# Patient Record
Sex: Male | Born: 1935 | Hispanic: Yes | Marital: Married | Smoking: Former smoker
Health system: Southern US, Community
[De-identification: ages and names within clinical notes are randomized; demographics above are authoritative.]

## PROBLEM LIST (undated history)

## (undated) DIAGNOSIS — K56609 Unspecified intestinal obstruction, unspecified as to partial versus complete obstruction: Secondary | ICD-10-CM

## (undated) DIAGNOSIS — C8591 Non-Hodgkin lymphoma, unspecified, lymph nodes of head, face, and neck: Secondary | ICD-10-CM

## (undated) DIAGNOSIS — E039 Hypothyroidism, unspecified: Secondary | ICD-10-CM

## (undated) HISTORY — DX: Hypothyroidism, unspecified: E03.9

## (undated) HISTORY — DX: Unspecified intestinal obstruction, unspecified as to partial versus complete obstruction: K56.609

---

## 2003-06-18 HISTORY — PX: LAPAROSCOPIC CHOLECYSTECTOMY: SUR755

## 2006-06-17 DIAGNOSIS — C8591 Non-Hodgkin lymphoma, unspecified, lymph nodes of head, face, and neck: Secondary | ICD-10-CM

## 2006-06-17 HISTORY — DX: Non-Hodgkin lymphoma, unspecified, lymph nodes of head, face, and neck: C85.91

## 2011-06-24 ENCOUNTER — Encounter: Payer: Self-pay | Admitting: Internal Medicine

## 2011-06-24 ENCOUNTER — Ambulatory Visit (INDEPENDENT_AMBULATORY_CARE_PROVIDER_SITE_OTHER): Payer: Medicare Other | Admitting: Internal Medicine

## 2011-06-24 VITALS — BP 128/80 | HR 66 | Temp 98.1°F | Resp 16 | Ht 63.0 in | Wt 156.0 lb

## 2011-06-24 DIAGNOSIS — Z0000 Encounter for general adult medical examination without abnormal findings: Secondary | ICD-10-CM

## 2011-06-24 NOTE — Patient Instructions (Signed)
It is important that you exercise regularly, at least 20 minutes 3 to 4 times per week.  If you develop chest pain or shortness of breath seek  medical attention.  Limit your sodium (Salt) intake  Return in one year for follow-up  Please obtain records from your previous primary care provider

## 2011-06-24 NOTE — Progress Notes (Signed)
Subjective:    Patient ID: Randall Williams, male    DOB: Jun 05, 1933, 78 y.o.   MRN: [id number]  HPI  78 year old patient who enjoys remarkably good health who has relocated to the area from [place]. He is a retired Comptroller and enjoys good health. He was hospitalized briefly in 2005 for a cholecystectomy otherwise no hospitalizations. He has no medical illnesses and takes no chronic medications.   1. Risk factors, based on past  M,S,F history  no cardiovascular risk factors 2.  Physical activities: Remains quite active with a regular exercise program. Actually played soccer occasionally  3.  Depression/mood: No history depression or mood disorder  4.  Hearing: No deficits  5.  ADL's: Independent in all aspects of daily living  6.  Fall risk: Low  7.  Home safety: no problems identified  8.  Height weight, and visual acuity; height and weight stable no change in visual acuity  9.  Counseling: Heart healthy diet regular exercise own curves 10. Lab orders based on risk factors: Will check laboratory panel at the time of his physical in 6 months 11. Referral :  12. Care plan: Not appropriate at this time  13. Cognitive assessment: Alert and oriented with normal affect. No cognitive dysfunction.   Family history unremarkable father died at 74 of complications of pneumonia mother died at 77 5 brothers 4 sisters in good health Born and raised in [place]   Review of Systems  Constitutional: Negative for fever, chills, appetite change and fatigue.  HENT: Negative for hearing loss, ear pain, congestion, sore throat, trouble swallowing, neck stiffness, dental problem, voice change and tinnitus.   Eyes: Negative for pain, discharge and visual disturbance.  Respiratory: Negative for cough, chest tightness, wheezing and stridor.   Cardiovascular: Negative for chest pain, palpitations and leg swelling.  Gastrointestinal: Negative for nausea, vomiting, abdominal pain, diarrhea,  constipation, blood in stool and abdominal distention.  Genitourinary: Negative for urgency, hematuria, flank pain, discharge, difficulty urinating and genital sores.  Musculoskeletal: Negative for myalgias, back pain, joint swelling, arthralgias and gait problem.  Skin: Negative for rash.  Neurological: Negative for dizziness, syncope, speech difficulty, weakness, numbness and headaches.  Hematological: Negative for adenopathy. Does not bruise/bleed easily.  Psychiatric/Behavioral: Negative for behavioral problems and dysphoric mood. The patient is not nervous/anxious.        Objective:   Physical Exam  Constitutional: He appears well-developed and well-nourished.  HENT:  Head: Normocephalic and atraumatic.  Right Ear: External ear normal.  Left Ear: External ear normal.  Nose: Nose normal.  Mouth/Throat: Oropharynx is clear and moist.  Eyes: Conjunctivae and EOM are normal. Pupils are equal, round, and reactive to light. No scleral icterus.  Neck: Normal range of motion. Neck supple. No JVD present. No thyromegaly present.  Cardiovascular: Regular rhythm, normal heart sounds and intact distal pulses.  Exam reveals no gallop and no friction rub.   No murmur heard. Pulmonary/Chest: Effort normal and breath sounds normal. He exhibits no tenderness.  Abdominal: Soft. Bowel sounds are normal. He exhibits no distension and no mass. There is no tenderness.  Musculoskeletal: Normal range of motion. He exhibits no edema and no tenderness.  Lymphadenopathy:    He has no cervical adenopathy.  Neurological: He is alert. He has normal reflexes. No cranial nerve deficit. Coordination normal.  Skin: Skin is warm and dry. No rash noted.  Psychiatric: He has a normal mood and affect. His behavior is normal.  Assessment & Plan:   Preventive health exam . Medical records will be obtained from his prior physician Return in one year for an annual exam

## 2011-12-17 ENCOUNTER — Ambulatory Visit (INDEPENDENT_AMBULATORY_CARE_PROVIDER_SITE_OTHER): Payer: Medicare Other | Admitting: Internal Medicine

## 2011-12-17 ENCOUNTER — Encounter: Payer: Self-pay | Admitting: Internal Medicine

## 2011-12-17 VITALS — BP 120/70 | Temp 98.5°F | Wt 155.0 lb

## 2011-12-17 DIAGNOSIS — H109 Unspecified conjunctivitis: Secondary | ICD-10-CM | POA: Diagnosis not present

## 2011-12-17 MED ORDER — SULFACETAMIDE SODIUM 10 % OP SOLN: 2.0000 [drp] | OPHTHALMIC | Status: AC

## 2011-12-17 NOTE — Patient Instructions (Signed)
Conjunctivitis Conjunctivitis is commonly called "pink eye." Conjunctivitis can be caused by bacterial or viral infection, allergies, or injuries. There is usually redness of the lining of the eye, itching, discomfort, and sometimes discharge. There may be deposits of matter along the eyelids. A viral infection usually causes a watery discharge, while a bacterial infection causes a yellowish, thick discharge. Pink eye is very contagious and spreads by direct contact. You may be given antibiotic eyedrops as part of your treatment. Before using your eye medicine, remove all drainage from the eye by washing gently with warm water and cotton balls. Continue to use the medication until you have awakened 2 mornings in a row without discharge from the eye. Do not rub your eye. This increases the irritation and helps spread infection. Use separate towels from other household members. Wash your hands with soap and water before and after touching your eyes. Use cold compresses to reduce pain and sunglasses to relieve irritation from light. Do not wear contact lenses or wear eye makeup until the infection is gone. SEEK MEDICAL CARE IF:   Your symptoms are not better after 3 days of treatment.   You have increased pain or trouble seeing.   The outer eyelids become very red or swollen.  Document Released: 07/11/2004 Document Revised: 05/23/2011 Document Reviewed: 06/03/2005 [employer] Patient Information 2012 [employer]. 

## 2011-12-17 NOTE — Progress Notes (Signed)
  Subjective:    Patient ID: Randall Williams, male    DOB: [birth year], 78 y.o.   MRN: [id number]  HPI  78 year old patient who presents with a several-day history of some irritation and redness involving the left eye. His chief complaint is itching. No change in visual acuity or eye pain.    Review of Systems  Eyes: Positive for redness and itching. Negative for photophobia, pain and visual disturbance.       Objective:   Physical Exam  Constitutional: He appears well-developed and well-nourished. No distress.  Eyes:       The left eye had conjunctival injection. No exudate noted cornea clear          Assessment & Plan:   Conjunctivitis. We'll treat with Bleph-10. Patient has not had a eye exam in greater than one-year. Encouraged to follow up with ophthalmology at his convenience

## 2012-02-03 ENCOUNTER — Ambulatory Visit (INDEPENDENT_AMBULATORY_CARE_PROVIDER_SITE_OTHER): Payer: Medicare Other | Admitting: Internal Medicine

## 2012-02-03 ENCOUNTER — Encounter: Payer: Self-pay | Admitting: Internal Medicine

## 2012-02-03 VITALS — BP 110/68 | Temp 97.9°F | Wt 152.0 lb

## 2012-02-03 DIAGNOSIS — H6120 Impacted cerumen, unspecified ear: Secondary | ICD-10-CM | POA: Diagnosis not present

## 2012-02-03 DIAGNOSIS — H918X9 Other specified hearing loss, unspecified ear: Secondary | ICD-10-CM

## 2012-02-03 NOTE — Patient Instructions (Signed)
Call or return to clinic prn if these symptoms worsen or fail to improve as anticipated.

## 2012-02-03 NOTE — Progress Notes (Signed)
  Subjective:    Patient ID: Randall Williams, male    DOB: 1932/09/02, 78 y.o.   MRN: [id number]  HPI  78 year old patient who enjoys remarkably good health. He takes no chronic medications. He presents today with a chief complaint of diminished auditory acuity on the left. Following left canal irrigation his hearing normal eyes    Review of Systems  HENT: Positive for hearing loss.        Objective:   Physical Exam  Constitutional: He appears well-developed and well-nourished. No distress.  HENT:       Left canal the cerumen impaction. Irrigated until clear          Assessment & Plan:   Left-sided cerumen impaction. Irrigated until clear

## 2012-02-20 DIAGNOSIS — Z23 Encounter for immunization: Secondary | ICD-10-CM | POA: Diagnosis not present

## 2012-06-22 ENCOUNTER — Ambulatory Visit (INDEPENDENT_AMBULATORY_CARE_PROVIDER_SITE_OTHER): Payer: Medicare Other | Admitting: Family Medicine

## 2012-06-22 ENCOUNTER — Encounter: Payer: Self-pay | Admitting: Family Medicine

## 2012-06-22 VITALS — BP 110/70 | HR 87 | Temp 98.6°F | Wt 153.0 lb

## 2012-06-22 DIAGNOSIS — J329 Chronic sinusitis, unspecified: Secondary | ICD-10-CM | POA: Diagnosis not present

## 2012-06-22 MED ORDER — HYDROCODONE-HOMATROPINE 5-1.5 MG/5ML PO SYRP: 5.0000 mL | ORAL_SOLUTION | ORAL | Status: AC | PRN

## 2012-06-22 MED ORDER — AZITHROMYCIN 250 MG PO TABS: ORAL_TABLET | ORAL | Status: DC

## 2012-06-22 NOTE — Progress Notes (Signed)
  Subjective:    Patient ID: Randall Williams, male    DOB: 07/30/1932, 79 y.o.   MRN: [id number]  HPI Here for 4 days of sinus pressure, PND, ST, and a dry cough. No fever.    Review of Systems  Constitutional: Negative.   HENT: Positive for congestion, postnasal drip and sinus pressure. Negative for rhinorrhea.   Eyes: Negative.   Respiratory: Negative.        Objective:   Physical Exam  Constitutional: He appears well-developed and well-nourished.  HENT:  Right Ear: External ear normal.  Left Ear: External ear normal.  Nose: Nose normal.  Mouth/Throat: Oropharynx is clear and moist.  Eyes: Conjunctivae normal are normal.  Neck: No thyromegaly present.  Pulmonary/Chest: Effort normal and breath sounds normal.  Lymphadenopathy:    He has no cervical adenopathy.          Assessment & Plan:  Add Mucinex

## 2012-06-23 ENCOUNTER — Ambulatory Visit: Payer: Medicare Other | Admitting: Internal Medicine

## 2013-02-04 ENCOUNTER — Encounter: Payer: Self-pay | Admitting: Internal Medicine

## 2013-02-04 ENCOUNTER — Ambulatory Visit (INDEPENDENT_AMBULATORY_CARE_PROVIDER_SITE_OTHER): Payer: Medicare Other | Admitting: Internal Medicine

## 2013-02-04 VITALS — BP 120/80 | HR 77 | Temp 98.2°F | Wt 155.0 lb

## 2013-02-04 DIAGNOSIS — L259 Unspecified contact dermatitis, unspecified cause: Secondary | ICD-10-CM

## 2013-02-04 MED ORDER — TRIAMCINOLONE ACETONIDE 0.1 % EX CREA: TOPICAL_CREAM | Freq: Two times a day (BID) | CUTANEOUS | Status: DC

## 2013-02-04 NOTE — Progress Notes (Signed)
  Subjective:    Patient ID: Randall Williams, male    DOB: 1932/12/15, 79 y.o.   MRN: [id number]  HPI  79 year old patient who was out doing yard work 5 days ago. The following day he noted a very pruritic rash involving primarily his forearms. He has been using 1% hydrocortisone with some benefit. Otherwise he feels well. Do to the parotid she has had some mild difficulty with sleep.  History reviewed. No pertinent past medical history.  History   Social History  . Marital Status: Married    Spouse Name: N/A    Number of Children: N/A  . Years of Education: N/A   Occupational History  . Not on file.   Social History Main Topics  . Smoking status: Former Smoker    Quit date: 06/17/1968  . Smokeless tobacco: Never Used  . Alcohol Use: No  . Drug Use: No  . Sexual Activity: Not on file   Other Topics Concern  . Not on file   Social History Narrative  . No narrative on file    History reviewed. No pertinent past surgical history.  No family history on file.  No Known Allergies  Current Outpatient Prescriptions on File Prior to Visit  Medication Sig Dispense Refill  . Multiple Vitamins-Minerals (EYE VITAMINS) CAPS Take by mouth daily.       No current facility-administered medications on file prior to visit.    BP 120/80  Pulse 77  Temp(Src) 98.2 F (36.8 C) (Oral)  Wt 155 lb (70.308 kg)  BMI 27.46 kg/m2  SpO2 96%       Review of Systems  Skin: Positive for rash.       Objective:   Physical Exam  Constitutional: He appears well-developed and well-nourished. No distress.  Skin: Rash noted.  Erythematous papular rash with some scaling noted involving the outer aspects of both lower arms          Assessment & Plan:  Content dermatitis. Will treat with at bedtime Benadryl as well as triamcinolone cream. Rash is fairly mild and limited

## 2013-02-04 NOTE — Patient Instructions (Addendum)
Benadryl 25 mg at bedtime  Use triamcinolone cream twice daily  Call or return to clinic prn if these symptoms worsen or fail to improve as anticipated. Contact Dermatitis Contact dermatitis is a rash that happens when something touches the skin. You touched something that irritates your skin, or you have allergies to something you touched. HOME CARE   Avoid the thing that caused your rash.  Keep your rash away from hot water, soap, sunlight, chemicals, and other things that might bother it.  Do not scratch your rash.  You can take cool baths to help stop itching.  Only take medicine as told by your doctor.  Keep all doctor visits as told. GET HELP RIGHT AWAY IF:   Your rash is not better after 3 days.  Your rash gets worse.  Your rash is puffy (swollen), tender, red, sore, or warm.  You have problems with your medicine. MAKE SURE YOU:   Understand these instructions.  Will watch your condition.  Will get help right away if you are not doing well or get worse. Document Released: 03/31/2009 Document Revised: 08/26/2011 Document Reviewed: 11/06/2010 [facility] Patient Information 2014 [place], [place].

## 2013-02-23 DIAGNOSIS — Z23 Encounter for immunization: Secondary | ICD-10-CM | POA: Diagnosis not present

## 2013-06-30 DIAGNOSIS — H3530 Unspecified macular degeneration: Secondary | ICD-10-CM | POA: Diagnosis not present

## 2013-11-11 ENCOUNTER — Ambulatory Visit (INDEPENDENT_AMBULATORY_CARE_PROVIDER_SITE_OTHER): Payer: Medicare Other | Admitting: Internal Medicine

## 2013-11-11 ENCOUNTER — Encounter: Payer: Self-pay | Admitting: Internal Medicine

## 2013-11-11 VITALS — BP 140/80 | HR 76 | Temp 98.2°F | Resp 20 | Ht 63.0 in | Wt 158.0 lb

## 2013-11-11 DIAGNOSIS — L719 Rosacea, unspecified: Secondary | ICD-10-CM | POA: Diagnosis not present

## 2013-11-11 MED ORDER — MINOCYCLINE HCL 100 MG PO CAPS: 100.0000 mg | ORAL_CAPSULE | Freq: Two times a day (BID) | ORAL | Status: DC

## 2013-11-11 NOTE — Patient Instructions (Signed)
Rosacea Rosacea is a long-term (chronic) condition that affects the skin of the face (cheeks, nose, brow, and chin) and sometimes the eyes. Rosacea causes the blood vessels near the surface of the skin to enlarge, resulting in redness. This condition usually begins after age 78. It occurs most often in light-skinned women. Without treatment, rosacea tends to get worse over time. There is no cure for rosacea, but treatment can help control your symptoms. CAUSES  The cause is unknown. It is thought that some people may inherit a tendency to develop rosacea. Certain triggers can make your rosacea worse, including:  Hot baths.  Exercise.  Sunlight.  Very hot or cold temperatures.  Hot or spicy foods and drinks.  Drinking alcohol.  Stress.  Taking blood pressure medicine.  Long-term use of topical steroids on the face. SYMPTOMS   Redness of the face.  Red bumps or pimples on the face.  Red, enlarged nose (rhinophyma).  Blushing easily.  Red lines on the skin.  Irritated or burning feeling in the eyes.  Swollen eyelids. DIAGNOSIS  Your caregiver can usually tell what is wrong by asking about your symptoms and performing a physical exam. TREATMENT  Avoiding triggers is an important part of treatment. You will also need to see a skin specialist (dermatologist) who can develop a treatment plan for you. The goals of treatment are to control your condition and to improve the appearance of your skin. It may take several weeks or months of treatment before you notice an improvement in your skin. Even after your skin improves, you will likely need to continue treatment to prevent your rosacea from coming back. Treatment methods may include:  Using sunscreen or sunblock daily to protect the skin.  Antibiotic medicine, such as metronidazole, applied directly to the skin.  Antibiotics taken by mouth. This is usually prescribed if you have eye problems from your rosacea.  Laser surgery  to improve the appearance of the skin. This surgery can reduce the appearance of red lines on the skin and can remove excess tissue from the nose to reduce its size. HOME CARE INSTRUCTIONS  Avoid things that seem to trigger your flare-ups.  If you are given antibiotics, take them as directed. Finish them even if you start to feel better.  Use a gentle facial cleanser that does not contain alcohol.  You may use a mild facial moisturizer.  Use a sunscreen or sunblock with SPF 30 or greater.  Wear a green-tinted foundation powder to conceal redness, if needed. Choose cosmetics that are noncomedogenic. This means they do not block your pores.  If your eyelids are affected, apply warm compresses to the eyelids. Do this up to 4 times a day or as directed by your caregiver. SEEK MEDICAL CARE IF:  Your skin problems get worse.  You feel depressed.  You lose your appetite.  You have trouble concentrating.  You have problems with your eyes, such as redness or itching. MAKE SURE YOU:  Understand these instructions.  Will watch your condition.  Will get help right away if you are not doing well or get worse. Document Released: 07/11/2004 Document Revised: 12/03/2011 Document Reviewed: 05/14/2011 [facility] Patient Information 2014 [place], [place].

## 2013-11-11 NOTE — Progress Notes (Signed)
Pre-visit discussion using our clinic review tool. No additional management support is needed unless otherwise documented below in the visit note.  

## 2013-11-11 NOTE — Progress Notes (Signed)
   Subjective:    Patient ID: Randall Williams, male    DOB: 1932/07/12, 80 y.o.   MRN: [id number]  HPI  80 year old patient, who presents with a chief complaint of a facial rash.  For the past several days.  He has had some scattered, erythematous papules about the malar aspect of the face.  He is requesting a refill of miocin and states the generic variety was not very effective or well-tolerated.  History reviewed. No pertinent past medical history.  History   Social History  . Marital Status: Married    Spouse Name: N/A    Number of Children: N/A  . Years of Education: N/A   Occupational History  . Not on file.   Social History Main Topics  . Smoking status: Former Smoker    Quit date: 06/17/1968  . Smokeless tobacco: Never Used  . Alcohol Use: No  . Drug Use: No  . Sexual Activity: Not on file   Other Topics Concern  . Not on file   Social History Narrative  . No narrative on file    History reviewed. No pertinent past surgical history.  No family history on file.  No Known Allergies  Current Outpatient Prescriptions on File Prior to Visit  Medication Sig Dispense Refill  . Multiple Vitamins-Minerals (EYE VITAMINS) CAPS Take by mouth daily.       No current facility-administered medications on file prior to visit.    BP 140/80  Pulse 76  Temp(Src) 98.2 F (36.8 C) (Oral)  Resp 20  Ht 5\' 3"  (1.6 m)  Wt 158 lb (71.668 kg)  BMI 28.00 kg/m2  SpO2 98%       Review of Systems  Constitutional: Negative for fever, chills, appetite change and fatigue.  HENT: Negative for congestion, dental problem, ear pain, hearing loss, sore throat, tinnitus, trouble swallowing and voice change.   Eyes: Negative for pain, discharge and visual disturbance.  Respiratory: Negative for cough, chest tightness, wheezing and stridor.   Cardiovascular: Negative for chest pain, palpitations and leg swelling.  Gastrointestinal: Negative for nausea, vomiting, abdominal pain,  diarrhea, constipation, blood in stool and abdominal distention.  Genitourinary: Negative for urgency, hematuria, flank pain, discharge, difficulty urinating and genital sores.  Musculoskeletal: Negative for arthralgias, back pain, gait problem, joint swelling, myalgias and neck stiffness.  Skin: Positive for rash.  Neurological: Negative for dizziness, syncope, speech difficulty, weakness, numbness and headaches.  Hematological: Negative for adenopathy. Does not bruise/bleed easily.  Psychiatric/Behavioral: Negative for behavioral problems and dysphoric mood. The patient is not nervous/anxious.        Objective:   Physical Exam  Constitutional: He appears well-developed and well-nourished. No distress.  Skin:  A few scattered, mildly erythematous papules noted about the lower aspect of the face.  This was consistent with mild rosacea          Assessment & Plan:   Mild rosacea.  Will treat with 10 days of minocin  Schedule CPX

## 2013-12-02 ENCOUNTER — Telehealth: Payer: Self-pay | Admitting: Internal Medicine

## 2013-12-02 NOTE — Telephone Encounter (Signed)
error 

## 2013-12-03 ENCOUNTER — Telehealth: Payer: Self-pay | Admitting: Internal Medicine

## 2013-12-03 NOTE — Telephone Encounter (Signed)
Randall Williams, Randall Williams is requesting re-fill on minocycline (MINOCIN) 100 MG capsule

## 2013-12-06 NOTE — Telephone Encounter (Signed)
Refuse, refill not appropriate.

## 2013-12-16 ENCOUNTER — Other Ambulatory Visit: Payer: Self-pay | Admitting: Internal Medicine

## 2013-12-20 NOTE — Telephone Encounter (Signed)
OK to RF

## 2013-12-20 NOTE — Telephone Encounter (Signed)
Dr. Raliegh Ip, okay to refill minocycline?

## 2013-12-21 ENCOUNTER — Telehealth: Payer: Self-pay | Admitting: Internal Medicine

## 2013-12-21 MED ORDER — ERYTHROMYCIN 2 % EX PADS: 1.0000 | MEDICATED_PAD | Freq: Two times a day (BID) | CUTANEOUS | Status: DC

## 2013-12-21 NOTE — Telephone Encounter (Signed)
[facility] is requesting re-fill on ERYCETTE PLEDGETS, 2%, QTY 60, APPLY TWICE A DAY TOPICALLY

## 2013-12-21 NOTE — Telephone Encounter (Signed)
ok 

## 2013-12-21 NOTE — Telephone Encounter (Signed)
Rx sent 

## 2013-12-21 NOTE — Telephone Encounter (Signed)
Please advise if okay to fill  

## 2013-12-24 ENCOUNTER — Other Ambulatory Visit: Payer: Self-pay | Admitting: Internal Medicine

## 2013-12-27 NOTE — Telephone Encounter (Signed)
Okay to refill Minocycline?

## 2014-02-24 DIAGNOSIS — Z23 Encounter for immunization: Secondary | ICD-10-CM | POA: Diagnosis not present

## 2014-03-06 ENCOUNTER — Encounter (HOSPITAL_COMMUNITY): Payer: Self-pay | Admitting: Emergency Medicine

## 2014-03-06 ENCOUNTER — Emergency Department (HOSPITAL_COMMUNITY): Payer: Medicare Other

## 2014-03-06 ENCOUNTER — Inpatient Hospital Stay (HOSPITAL_COMMUNITY)
Admission: EM | Admit: 2014-03-06 | Discharge: 2014-03-09 | DRG: 390 | Disposition: A | Discharge status: Home / Self Care | Payer: Medicare Other | Attending: Surgery | Admitting: Surgery

## 2014-03-06 DIAGNOSIS — K56609 Unspecified intestinal obstruction, unspecified as to partial versus complete obstruction: Principal | ICD-10-CM | POA: Diagnosis present

## 2014-03-06 DIAGNOSIS — R112 Nausea with vomiting, unspecified: Secondary | ICD-10-CM | POA: Diagnosis not present

## 2014-03-06 DIAGNOSIS — K4090 Unilateral inguinal hernia, without obstruction or gangrene, not specified as recurrent: Secondary | ICD-10-CM | POA: Diagnosis not present

## 2014-03-06 DIAGNOSIS — K5669 Other intestinal obstruction: Secondary | ICD-10-CM | POA: Diagnosis not present

## 2014-03-06 DIAGNOSIS — Z87891 Personal history of nicotine dependence: Secondary | ICD-10-CM

## 2014-03-06 HISTORY — DX: Non-Hodgkin lymphoma, unspecified, lymph nodes of head, face, and neck: C85.91

## 2014-03-06 HISTORY — DX: Unspecified intestinal obstruction, unspecified as to partial versus complete obstruction: K56.609

## 2014-03-06 LAB — COMPREHENSIVE METABOLIC PANEL
ALT: 23 U/L (ref 0–53)
ANION GAP: 14 (ref 5–15)
AST: 25 U/L (ref 0–37)
Albumin: 3.8 g/dL (ref 3.5–5.2)
Alkaline Phosphatase: 98 U/L (ref 39–117)
BUN: 32 mg/dL — AB (ref 6–23)
CALCIUM: 9.5 mg/dL (ref 8.4–10.5)
CO2: 27 mEq/L (ref 19–32)
CREATININE: 1.35 mg/dL (ref 0.50–1.35)
Chloride: 95 mEq/L — ABNORMAL LOW (ref 96–112)
GFR calc Af Amer: 56 mL/min — ABNORMAL LOW (ref >90)
GFR calc non Af Amer: 48 mL/min — ABNORMAL LOW (ref >90)
Glucose, Bld: 148 mg/dL — ABNORMAL HIGH (ref 70–99)
Potassium: 4.8 mEq/L (ref 3.7–5.3)
Sodium: 136 mEq/L — ABNORMAL LOW (ref 137–147)
TOTAL PROTEIN: 7.9 g/dL (ref 6.0–8.3)
Total Bilirubin: 1.1 mg/dL (ref 0.3–1.2)

## 2014-03-06 LAB — I-STAT CG4 LACTIC ACID, ED: Lactic Acid, Venous: 1.39 mmol/L (ref 0.5–2.2)

## 2014-03-06 LAB — CBC WITH DIFFERENTIAL/PLATELET
BASOS PCT: 0 % (ref 0–1)
Basophils Absolute: 0 10*3/uL (ref 0.0–0.1)
EOS ABS: 0 10*3/uL (ref 0.0–0.7)
Eosinophils Relative: 0 % (ref 0–5)
HEMATOCRIT: 43.2 % (ref 39.0–52.0)
Hemoglobin: 15.3 g/dL (ref 13.0–17.0)
Lymphocytes Relative: 22 % (ref 12–46)
Lymphs Abs: 2.3 10*3/uL (ref 0.7–4.0)
MCH: 31.3 pg (ref 26.0–34.0)
MCHC: 35.4 g/dL (ref 30.0–36.0)
MCV: 88.3 fL (ref 78.0–100.0)
MONO ABS: 0.6 10*3/uL (ref 0.1–1.0)
MONOS PCT: 5 % (ref 3–12)
NEUTROS PCT: 73 % (ref 43–77)
Neutro Abs: 7.6 10*3/uL (ref 1.7–7.7)
Platelets: 227 10*3/uL (ref 150–400)
RBC: 4.89 MIL/uL (ref 4.22–5.81)
RDW: 13.7 % (ref 11.5–15.5)
WBC: 10.5 10*3/uL (ref 4.0–10.5)

## 2014-03-06 LAB — URINALYSIS, ROUTINE W REFLEX MICROSCOPIC
BILIRUBIN URINE: NEGATIVE
Glucose, UA: NEGATIVE mg/dL
Hgb urine dipstick: NEGATIVE
Ketones, ur: 15 mg/dL — AB
LEUKOCYTES UA: NEGATIVE
NITRITE: NEGATIVE
PH: 5.5 (ref 5.0–8.0)
Protein, ur: 30 mg/dL — AB
Specific Gravity, Urine: 1.028 (ref 1.005–1.030)
UROBILINOGEN UA: 0.2 mg/dL (ref 0.0–1.0)

## 2014-03-06 LAB — URINE MICROSCOPIC-ADD ON

## 2014-03-06 LAB — LIPASE, BLOOD: LIPASE: 29 U/L (ref 11–59)

## 2014-03-06 MED ORDER — ALUM & MAG HYDROXIDE-SIMETH 200-200-20 MG/5ML PO SUSP
15.0000 mL | Freq: Once | ORAL | Status: DC
  Filled 2014-03-06: qty 30

## 2014-03-06 MED ORDER — IOHEXOL 300 MG/ML  SOLN
25.0000 mL | Freq: Once | INTRAMUSCULAR | Status: AC | PRN
  Administered 2014-03-06: 25 mL via ORAL

## 2014-03-06 MED ORDER — MORPHINE SULFATE 2 MG/ML IJ SOLN: 1.0000 mg | INTRAMUSCULAR | Status: DC | PRN

## 2014-03-06 MED ORDER — IOHEXOL 300 MG/ML  SOLN
100.0000 mL | Freq: Once | INTRAMUSCULAR | Status: AC | PRN
  Administered 2014-03-06: 100 mL via INTRAVENOUS

## 2014-03-06 MED ORDER — ACETAMINOPHEN 325 MG PO TABS: 650.0000 mg | ORAL_TABLET | Freq: Four times a day (QID) | ORAL | Status: DC | PRN

## 2014-03-06 MED ORDER — ENOXAPARIN SODIUM 40 MG/0.4ML ~~LOC~~ SOLN
40.0000 mg | SUBCUTANEOUS | Status: DC
  Administered 2014-03-07 – 2014-03-09 (×3): 40 mg via SUBCUTANEOUS
  Filled 2014-03-06 (×3): qty 0.4

## 2014-03-06 MED ORDER — ACETAMINOPHEN 650 MG RE SUPP: 650.0000 mg | Freq: Four times a day (QID) | RECTAL | Status: DC | PRN

## 2014-03-06 MED ORDER — DIPHENHYDRAMINE HCL 50 MG/ML IJ SOLN: 12.5000 mg | Freq: Four times a day (QID) | INTRAMUSCULAR | Status: DC | PRN

## 2014-03-06 MED ORDER — PANTOPRAZOLE SODIUM 40 MG IV SOLR
40.0000 mg | Freq: Every day | INTRAVENOUS | Status: DC
  Administered 2014-03-06 – 2014-03-08 (×3): 40 mg via INTRAVENOUS
  Filled 2014-03-06 (×3): qty 40

## 2014-03-06 MED ORDER — KCL IN DEXTROSE-NACL 20-5-0.45 MEQ/L-%-% IV SOLN
INTRAVENOUS | Status: DC
  Administered 2014-03-06 – 2014-03-09 (×7): via INTRAVENOUS
  Filled 2014-03-06 (×11): qty 1000

## 2014-03-06 MED ORDER — DIPHENHYDRAMINE HCL 12.5 MG/5ML PO ELIX
12.5000 mg | ORAL_SOLUTION | Freq: Four times a day (QID) | ORAL | Status: DC | PRN
Start: 2014-03-06 — End: 2014-03-09

## 2014-03-06 MED ORDER — ONDANSETRON HCL 4 MG/2ML IJ SOLN: 4.0000 mg | Freq: Four times a day (QID) | INTRAMUSCULAR | Status: DC | PRN

## 2014-03-06 NOTE — ED Notes (Signed)
The pt is c/o generalized abd pain sinc Friday.  His last bm was Saturday.  C/o n v

## 2014-03-06 NOTE — ED Provider Notes (Signed)
CSN: [id number]     Arrival date & time 03/06/14  1603 History   First MD Initiated Contact with Patient 03/06/14 1652     Chief Complaint  Patient presents with  . Abdominal Pain    Patient is a 80 y.o. male presenting with abdominal pain. The history is provided by the patient.  Abdominal Pain Pain location:  Generalized (but more epigastric) Pain quality: aching and cramping   Pain radiates to:  Does not radiate Pain severity:  Mild Onset quality:  Gradual Duration:  2 days Progression:  Waxing and waning Chronicity:  New Context: eating   Context: not recent travel, not sick contacts, not suspicious food intake and not trauma   Context comment:  After eating meal with wife who did not get sick at home Relieved by:  Nothing Exacerbated by: chamomille. Associated symptoms: diarrhea (1 episode NB yesterday) and flatus   Associated symptoms: no anorexia (no pain with meals now), no chest pain, no chills, no constipation, no cough, no dysuria, no fatigue, no fever, no hematemesis, no hematochezia, no melena, no nausea, no shortness of breath and no vomiting   Risk factors: being elderly and multiple surgeries (lap chole)     History reviewed. No pertinent past medical history. History reviewed. No pertinent past surgical history. No family history on file. History  Substance Use Topics  . Smoking status: Former Smoker    Quit date: 06/17/1968  . Smokeless tobacco: Never Used  . Alcohol Use: No    Review of Systems  Constitutional: Negative for fever, chills and fatigue.  Respiratory: Negative for cough, shortness of breath and wheezing.   Cardiovascular: Negative for chest pain.  Gastrointestinal: Positive for abdominal pain, diarrhea (1 episode NB yesterday) and flatus. Negative for nausea, vomiting, constipation, melena, hematochezia, anorexia (no pain with meals now) and hematemesis.  Genitourinary: Negative for dysuria.  Musculoskeletal: Negative for back pain.  Skin:  Negative for rash.  All other systems reviewed and are negative.     Allergies  Review of patient's allergies indicates no known allergies.  Home Medications   Prior to Admission medications   Medication Sig Start Date End Date Taking? Authorizing Provider  Erythromycin 2 % PADS Apply 1 each topically 2 (two) times daily. 12/21/13   Marletta Lor, MD  minocycline (MINOCIN,DYNACIN) 100 MG capsule TAKE 1 CAPSULE (100 MG TOTAL) BY MOUTH 2 (TWO) TIMES DAILY.    Marletta Lor, MD  Multiple Vitamins-Minerals (EYE VITAMINS) CAPS Take by mouth daily.    Historical Provider, MD  vitamin C (ASCORBIC ACID) 500 MG tablet Take 500 mg by mouth daily.    Historical Provider, MD   BP 131/88  Pulse 92  Temp(Src) 98.8 F (37.1 C) (Oral)  Resp 16  Ht 5\' 4"  (1.626 m)  Wt 150 lb (68.04 kg)  BMI 25.73 kg/m2  SpO2 98% Physical Exam  Nursing note and vitals reviewed. Constitutional: He is oriented to person, place, and time. He appears well-developed and well-nourished. No distress.  HENT:  Head: Normocephalic and atraumatic.  Nose: Nose normal.  Eyes: Conjunctivae are normal.  Neck: Normal range of motion. Neck supple. No tracheal deviation present.  Cardiovascular: Normal rate, regular rhythm, normal heart sounds and intact distal pulses.   No murmur heard. Pulmonary/Chest: Effort normal and breath sounds normal. No respiratory distress. He has no rales.  Abdominal: Soft. Bowel sounds are normal. He exhibits distension. He exhibits no mass. There is tenderness (epigastric).  Neg cvat  Musculoskeletal: Normal range  of motion. He exhibits no edema.  Neurological: He is alert and oriented to person, place, and time.  Skin: Skin is warm and dry. No rash noted.  Psychiatric: He has a normal mood and affect.    ED Course  Procedures (including critical care time) Labs Review Labs Reviewed  COMPREHENSIVE METABOLIC PANEL - Abnormal; Notable for the following:    Sodium 136 (*)     Chloride 95 (*)    Glucose, Bld 148 (*)    BUN 32 (*)    GFR calc non Af Amer 48 (*)    GFR calc Af Amer 56 (*)    All other components within normal limits  URINALYSIS, ROUTINE W REFLEX MICROSCOPIC - Abnormal; Notable for the following:    Color, Urine AMBER (*)    Ketones, ur 15 (*)    Protein, ur 30 (*)    All other components within normal limits  CBC WITH DIFFERENTIAL  LIPASE, BLOOD  URINE MICROSCOPIC-ADD ON  I-STAT CG4 LACTIC ACID, ED    Imaging Review Ct Abdomen Pelvis W Contrast  03/06/2014   CLINICAL DATA:  Generalized abdominal pain for the past 2 days with nausea and vomiting ; last bowel movement yesterday  EXAM: CT ABDOMEN AND PELVIS WITH CONTRAST  TECHNIQUE: Multidetector CT imaging of the abdomen and pelvis was performed using the standard protocol following bolus administration of intravenous contrast.  CONTRAST:  124mL OMNIPAQUE IOHEXOL 300 MG/ML SOLN intravenously ; the patient also received oral contrast material.  COMPARISON:  None.  FINDINGS: The stomach is quite distended with food, contrast, and gas. No significant contrast has entered the bowel. The jejunum and ileum are mildly distended with gas and fluid. A small portion of the distal ileum is normal in caliber as is the terminal ileum. The colon is not abnormally distended. There is there is diverticulosis of the sigmoid and mild thickening of the wall of the mid sigmoid. There is a small amount of free fluid in the right lower quadrant of the pelvis. There is a shallow right inguinal hernia containing fat and fluid but no bowel. The appendix is not discretely demonstrated.  The gallbladder is surgically absent. There is a small amount of fluid adjacent to the dome of the liver. There is no focal hepatic mass. The pancreas, spleen, adrenal glands, and kidneys exhibit no acute abnormalities. The caliber of the abdominal aorta is normal.  The lung bases are clear. The lumbar spine exhibits degenerative disc change at  multiple levels. The bony pelvis is unremarkable.  IMPRESSION: 1. There is a distal small bowel obstruction. The precise location of the obstruction is not clearly evident. There is considerable distention of the stomach. Nasogastric suction would be useful. 2. There is mild sigmoid diverticulosis and subjective mild thickening of the sigmoid colonic wall. One cannot exclude low-grade diverticulitis. There is no discrete abscess though there is a small amount of fluid separate from the sigmoid in the right aspect of the pelvis. 3. The appendix is not discretely demonstrated but there is no pericecal inflammatory change. 4. There is a small right inguinal hernia containing fluid but no bowel. 5. There is no acute hepatobiliary nor acute urinary tract abnormality. 6. Surgical consultation is recommended. 7. These results were called by telephone at the time of interpretation on 03/06/2014 at 6:37 pm to Dr. Venora Maples, who verbally acknowledged these results.   Electronically Signed   By: David  Martinique   On: 03/06/2014 18:37     EKG Interpretation None  MDM   Final diagnoses:  SBO (small bowel obstruction)    Elderly gentleman who presents for persistent abd pain for 3 days after eating meal. Had 1 episode n/v/d but pain has persisted. Considered food poisoning but non bloody stools and wife has not been sick. Well appearing. VSS. Non toxic appearing.  Distal SBO found on CT.  Lactic acid wnl.  NGT placed with 1L output of gastric contents.  6:43 PM consulted general surgery, Dr. Hulen Skains. Will admit to surgery service.     Tammy Sours, MD 03/06/14 2330

## 2014-03-06 NOTE — ED Notes (Signed)
Attempted to call report

## 2014-03-06 NOTE — H&P (Signed)
Randall Williams is an 80 y.o. male.   Chief Complaint: Abdominal pain, nausea and vomiting HPI: The patient has been sick since Friday evening (2 days ago) with generalized abdominal discomfort and just not feeling well.  This persisted until Saturday when he started having more nausea and vomiting.  Pain persisted.  Came into the ED today where CT scan demonstrated a high grade SBO with an enlarged stomach.  Surgery was called  History reviewed. No pertinent past medical history.  History reviewed. No pertinent past surgical history.  No family history on file. Social History:  reports that he quit smoking about 45 years ago. He has never used smokeless tobacco. He reports that he does not drink alcohol or use illicit drugs.  Allergies: No Known Allergies   (Not in a hospital admission)  Results for orders placed during the hospital encounter of 03/06/14 (from the past 48 hour(s))  URINALYSIS, ROUTINE W REFLEX MICROSCOPIC     Status: Abnormal   Collection Time    03/06/14  4:28 PM      Result Value Ref Range   Color, Urine AMBER (*) YELLOW   Comment: BIOCHEMICALS MAY BE AFFECTED BY COLOR   APPearance CLEAR  CLEAR   Specific Gravity, Urine 1.028  1.005 - 1.030   pH 5.5  5.0 - 8.0   Glucose, UA NEGATIVE  NEGATIVE mg/dL   Hgb urine dipstick NEGATIVE  NEGATIVE   Bilirubin Urine NEGATIVE  NEGATIVE   Ketones, ur 15 (*) NEGATIVE mg/dL   Protein, ur 30 (*) NEGATIVE mg/dL   Urobilinogen, UA 0.2  0.0 - 1.0 mg/dL   Nitrite NEGATIVE  NEGATIVE   Leukocytes, UA NEGATIVE  NEGATIVE  URINE MICROSCOPIC-ADD ON     Status: None   Collection Time    03/06/14  4:28 PM      Result Value Ref Range   WBC, UA 0-2  <3 WBC/hpf   Urine-Other MUCOUS PRESENT    CBC WITH DIFFERENTIAL     Status: None   Collection Time    03/06/14  4:33 PM      Result Value Ref Range   WBC 10.5  4.0 - 10.5 K/uL   RBC 4.89  4.22 - 5.81 MIL/uL   Hemoglobin 15.3  13.0 - 17.0 g/dL   HCT 43.2  39.0 - 52.0 %   MCV 88.3   78.0 - 100.0 fL   MCH 31.3  26.0 - 34.0 pg   MCHC 35.4  30.0 - 36.0 g/dL   RDW 13.7  11.5 - 15.5 %   Platelets 227  150 - 400 K/uL   Neutrophils Relative % 73  43 - 77 %   Neutro Abs 7.6  1.7 - 7.7 K/uL   Lymphocytes Relative 22  12 - 46 %   Lymphs Abs 2.3  0.7 - 4.0 K/uL   Monocytes Relative 5  3 - 12 %   Monocytes Absolute 0.6  0.1 - 1.0 K/uL   Eosinophils Relative 0  0 - 5 %   Eosinophils Absolute 0.0  0.0 - 0.7 K/uL   Basophils Relative 0  0 - 1 %   Basophils Absolute 0.0  0.0 - 0.1 K/uL  COMPREHENSIVE METABOLIC PANEL     Status: Abnormal   Collection Time    03/06/14  4:33 PM      Result Value Ref Range   Sodium 136 (*) 137 - 147 mEq/L   Potassium 4.8  3.7 - 5.3 mEq/L   Chloride 95 (*)  96 - 112 mEq/L   CO2 27  19 - 32 mEq/L   Glucose, Bld 148 (*) 70 - 99 mg/dL   BUN 32 (*) 6 - 23 mg/dL   Creatinine, Ser 1.35  0.50 - 1.35 mg/dL   Calcium 9.5  8.4 - 10.5 mg/dL   Total Protein 7.9  6.0 - 8.3 g/dL   Albumin 3.8  3.5 - 5.2 g/dL   AST 25  0 - 37 U/L   ALT 23  0 - 53 U/L   Alkaline Phosphatase 98  39 - 117 U/L   Total Bilirubin 1.1  0.3 - 1.2 mg/dL   GFR calc non Af Amer 48 (*) >90 mL/min   GFR calc Af Amer 56 (*) >90 mL/min   Comment: (NOTE)     The eGFR has been calculated using the CKD EPI equation.     This calculation has not been validated in all clinical situations.     eGFR's persistently <90 mL/min signify possible Chronic Kidney     Disease.   Anion gap 14  5 - 15  LIPASE, BLOOD     Status: None   Collection Time    03/06/14  4:33 PM      Result Value Ref Range   Lipase 29  11 - 59 U/L  I-STAT CG4 LACTIC ACID, ED     Status: None   Collection Time    03/06/14  6:57 PM      Result Value Ref Range   Lactic Acid, Venous 1.39  0.5 - 2.2 mmol/L   Ct Abdomen Pelvis W Contrast  03/06/2014   CLINICAL DATA:  Generalized abdominal pain for the past 2 days with nausea and vomiting ; last bowel movement yesterday  EXAM: CT ABDOMEN AND PELVIS WITH CONTRAST  TECHNIQUE:  Multidetector CT imaging of the abdomen and pelvis was performed using the standard protocol following bolus administration of intravenous contrast.  CONTRAST:  12m OMNIPAQUE IOHEXOL 300 MG/ML SOLN intravenously ; the patient also received oral contrast material.  COMPARISON:  None.  FINDINGS: The stomach is quite distended with food, contrast, and gas. No significant contrast has entered the bowel. The jejunum and ileum are mildly distended with gas and fluid. A small portion of the distal ileum is normal in caliber as is the terminal ileum. The colon is not abnormally distended. There is there is diverticulosis of the sigmoid and mild thickening of the wall of the mid sigmoid. There is a small amount of free fluid in the right lower quadrant of the pelvis. There is a shallow right inguinal hernia containing fat and fluid but no bowel. The appendix is not discretely demonstrated.  The gallbladder is surgically absent. There is a small amount of fluid adjacent to the dome of the liver. There is no focal hepatic mass. The pancreas, spleen, adrenal glands, and kidneys exhibit no acute abnormalities. The caliber of the abdominal aorta is normal.  The lung bases are clear. The lumbar spine exhibits degenerative disc change at multiple levels. The bony pelvis is unremarkable.  IMPRESSION: 1. There is a distal small bowel obstruction. The precise location of the obstruction is not clearly evident. There is considerable distention of the stomach. Nasogastric suction would be useful. 2. There is mild sigmoid diverticulosis and subjective mild thickening of the sigmoid colonic wall. One cannot exclude low-grade diverticulitis. There is no discrete abscess though there is a small amount of fluid separate from the sigmoid in the right aspect of the pelvis.  3. The appendix is not discretely demonstrated but there is no pericecal inflammatory change. 4. There is a small right inguinal hernia containing fluid but no bowel. 5.  There is no acute hepatobiliary nor acute urinary tract abnormality. 6. Surgical consultation is recommended. 7. These results were called by telephone at the time of interpretation on 03/06/2014 at 6:37 pm to Dr. Venora Maples, who verbally acknowledged these results.   Electronically Signed   By: David  Martinique   On: 03/06/2014 18:37    Review of Systems  HENT: Negative.   Eyes: Negative.   Respiratory: Negative.   Cardiovascular: Negative.   Gastrointestinal: Positive for nausea, vomiting and abdominal pain.  Genitourinary: Negative.   Musculoskeletal: Negative.   Skin: Negative.   Neurological: Positive for weakness.  Endo/Heme/Allergies: Negative.   Psychiatric/Behavioral: Negative.     Blood pressure 147/80, pulse 84, temperature 98.8 F (37.1 C), temperature source Oral, resp. rate 19, height _0  (1.626 m), weight 68.04 kg (150 lb), SpO2 94.00%. Physical Exam  Constitutional: He is oriented to person, place, and time. He appears well-developed and well-nourished.  HENT:  Head: Normocephalic and atraumatic.  Eyes: Conjunctivae and EOM are normal. Pupils are equal, round, and reactive to light.  Neck: Normal range of motion. Neck supple.  Cardiovascular: Normal rate, regular rhythm, S1 normal and S2 normal.   Murmur heard.  Crescendo systolic murmur is present with a grade of 1/6  Pulses:      Carotid pulses are 2+ on the right side, and 2+ on the left side.      Radial pulses are 2+ on the right side, and 2+ on the left side.       Femoral pulses are 2+ on the right side, and 2+ on the left side.      Popliteal pulses are 2+ on the right side, and 2+ on the left side.       Dorsalis pedis pulses are 2+ on the right side, and 2+ on the left side.       Posterior tibial pulses are 2+ on the right side, and 2+ on the left side.  Respiratory: Effort normal and breath sounds normal.  GI: Soft. He exhibits distension (but improved with NGT).  Hypoactive bowel sounds.  Genitourinary:  Penis normal.  Musculoskeletal: Normal range of motion.  Neurological: He is alert and oriented to person, place, and time. He has normal reflexes.  Skin: Skin is warm and dry.  Psychiatric: He has a normal mood and affect. His behavior is normal. Judgment and thought content normal.     Assessment/Plan SBO on CT and clinically  Admit for NGT decompression. IV hydration. No antibiotics. Repeat abdominal films in the AM  Gitel Beste, JAY 03/06/2014, 8:03 PM

## 2014-03-07 ENCOUNTER — Inpatient Hospital Stay (HOSPITAL_COMMUNITY): Payer: Medicare Other

## 2014-03-07 LAB — BASIC METABOLIC PANEL
ANION GAP: 11 (ref 5–15)
BUN: 22 mg/dL (ref 6–23)
CALCIUM: 8.4 mg/dL (ref 8.4–10.5)
CO2: 27 mEq/L (ref 19–32)
CREATININE: 1.22 mg/dL (ref 0.50–1.35)
Chloride: 97 mEq/L (ref 96–112)
GFR calc Af Amer: 63 mL/min — ABNORMAL LOW (ref >90)
GFR calc non Af Amer: 55 mL/min — ABNORMAL LOW (ref >90)
Glucose, Bld: 131 mg/dL — ABNORMAL HIGH (ref 70–99)
Potassium: 4 mEq/L (ref 3.7–5.3)
SODIUM: 135 meq/L — AB (ref 137–147)

## 2014-03-07 LAB — CBC
HCT: 38.6 % — ABNORMAL LOW (ref 39.0–52.0)
HEMOGLOBIN: 13.4 g/dL (ref 13.0–17.0)
MCH: 30.8 pg (ref 26.0–34.0)
MCHC: 34.7 g/dL (ref 30.0–36.0)
MCV: 88.7 fL (ref 78.0–100.0)
Platelets: 211 10*3/uL (ref 150–400)
RBC: 4.35 MIL/uL (ref 4.22–5.81)
RDW: 13.6 % (ref 11.5–15.5)
WBC: 7.5 10*3/uL (ref 4.0–10.5)

## 2014-03-07 MED ORDER — CHLORHEXIDINE GLUCONATE 0.12 % MT SOLN
15.0000 mL | Freq: Two times a day (BID) | OROMUCOSAL | Status: DC
  Administered 2014-03-07 – 2014-03-08 (×3): 15 mL via OROMUCOSAL
  Filled 2014-03-07 (×2): qty 15

## 2014-03-07 MED ORDER — CETYLPYRIDINIUM CHLORIDE 0.05 % MT LIQD
7.0000 mL | Freq: Two times a day (BID) | OROMUCOSAL | Status: DC
  Administered 2014-03-07 – 2014-03-08 (×4): 7 mL via OROMUCOSAL

## 2014-03-07 NOTE — ED Provider Notes (Signed)
I saw and evaluated the patient, reviewed the resident's note and I agree with the findings and plan.   EKG Interpretation None      The patient will be admitted for small bowel obstruction.  Admit to general surgery.  NG tube place.  Overall well-appearing.  No peritoneal signs.  Ct Abdomen Pelvis W Contrast  03/06/2014   CLINICAL DATA:  Generalized abdominal pain for the past 2 days with nausea and vomiting ; last bowel movement yesterday  EXAM: CT ABDOMEN AND PELVIS WITH CONTRAST  TECHNIQUE: Multidetector CT imaging of the abdomen and pelvis was performed using the standard protocol following bolus administration of intravenous contrast.  CONTRAST:  144mL OMNIPAQUE IOHEXOL 300 MG/ML SOLN intravenously ; the patient also received oral contrast material.  COMPARISON:  None.  FINDINGS: The stomach is quite distended with food, contrast, and gas. No significant contrast has entered the bowel. The jejunum and ileum are mildly distended with gas and fluid. A small portion of the distal ileum is normal in caliber as is the terminal ileum. The colon is not abnormally distended. There is there is diverticulosis of the sigmoid and mild thickening of the wall of the mid sigmoid. There is a small amount of free fluid in the right lower quadrant of the pelvis. There is a shallow right inguinal hernia containing fat and fluid but no bowel. The appendix is not discretely demonstrated.  The gallbladder is surgically absent. There is a small amount of fluid adjacent to the dome of the liver. There is no focal hepatic mass. The pancreas, spleen, adrenal glands, and kidneys exhibit no acute abnormalities. The caliber of the abdominal aorta is normal.  The lung bases are clear. The lumbar spine exhibits degenerative disc change at multiple levels. The bony pelvis is unremarkable.  IMPRESSION: 1. There is a distal small bowel obstruction. The precise location of the obstruction is not clearly evident. There is considerable  distention of the stomach. Nasogastric suction would be useful. 2. There is mild sigmoid diverticulosis and subjective mild thickening of the sigmoid colonic wall. One cannot exclude low-grade diverticulitis. There is no discrete abscess though there is a small amount of fluid separate from the sigmoid in the right aspect of the pelvis. 3. The appendix is not discretely demonstrated but there is no pericecal inflammatory change. 4. There is a small right inguinal hernia containing fluid but no bowel. 5. There is no acute hepatobiliary nor acute urinary tract abnormality. 6. Surgical consultation is recommended. 7. These results were called by telephone at the time of interpretation on 03/06/2014 at 6:37 pm to Dr. Venora Maples, who verbally acknowledged these results.   Electronically Signed   By: David  Martinique   On: 03/06/2014 18:37  I personally reviewed the imaging tests through PACS system I reviewed available ER/hospitalization records through the [facility], MD 03/07/14 [phone]

## 2014-03-07 NOTE — Progress Notes (Signed)
Already passing flatus. Continue NG tube today Encourage ambulation.  Imogene Burn. Georgette Dover, MD, [facility]  General/ Trauma Surgery  03/07/2014 10:18 AM

## 2014-03-07 NOTE — Progress Notes (Signed)
Patient ID: Randall Williams, male   DOB: [birth year], 80 y.o.   MRN: [id number]    Subjective: Pt feels better today.  Some flatus, but no BM.  Objective: Vital signs in last 24 hours: Temp:  [97.2 F (36.2 C)-99.1 F (37.3 C)] 97.2 F (36.2 C) (09/21 0519) Pulse Rate:  [75-92] 75 (09/21 0519) Resp:  [16-25] 24 (09/21 0519) BP: (105-155)/(61-88) 105/61 mmHg (09/21 0519) SpO2:  [92 %-98 %] 95 % (09/21 0519) Weight:  [150 lb (68.04 kg)-159 lb 11.2 oz (72.439 kg)] 159 lb 11.2 oz (72.439 kg) (09/20 2200) Last BM Date: 03/05/14  Intake/Output from previous day: 09/20 0701 - 09/21 0700 In: 906 [I.V.:906] Out: 2650 [Urine:350; Emesis/NG output:1400] Intake/Output this shift:    PE: Abd: soft, NT, ND, +BS, NGT with some bilious output Heart: regular Lungs: CTAB  Lab Results:   Recent Labs  03/06/14 1633  WBC 10.5  HGB 15.3  HCT 43.2  PLT 227   BMET  Recent Labs  03/06/14 1633  NA 136*  K 4.8  CL 95*  CO2 27  GLUCOSE 148*  BUN 32*  CREATININE 1.35  CALCIUM 9.5   PT/INR No results found for this basename: LABPROT, INR,  in the last 72 hours CMP     Component Value Date/Time   NA 136* 03/06/2014 1633   K 4.8 03/06/2014 1633   CL 95* 03/06/2014 1633   CO2 27 03/06/2014 1633   GLUCOSE 148* 03/06/2014 1633   BUN 32* 03/06/2014 1633   CREATININE 1.35 03/06/2014 1633   CALCIUM 9.5 03/06/2014 1633   PROT 7.9 03/06/2014 1633   ALBUMIN 3.8 03/06/2014 1633   AST 25 03/06/2014 1633   ALT 23 03/06/2014 1633   ALKPHOS 98 03/06/2014 1633   BILITOT 1.1 03/06/2014 1633   GFRNONAA 48* 03/06/2014 1633   GFRAA 56* 03/06/2014 1633   Lipase     Component Value Date/Time   LIPASE 29 03/06/2014 1633       Studies/Results: Ct Abdomen Pelvis W Contrast  03/06/2014   CLINICAL DATA:  Generalized abdominal pain for the past 2 days with nausea and vomiting ; last bowel movement yesterday  EXAM: CT ABDOMEN AND PELVIS WITH CONTRAST  TECHNIQUE: Multidetector CT imaging of the abdomen and  pelvis was performed using the standard protocol following bolus administration of intravenous contrast.  CONTRAST:  170mL OMNIPAQUE IOHEXOL 300 MG/ML SOLN intravenously ; the patient also received oral contrast material.  COMPARISON:  None.  FINDINGS: The stomach is quite distended with food, contrast, and gas. No significant contrast has entered the bowel. The jejunum and ileum are mildly distended with gas and fluid. A small portion of the distal ileum is normal in caliber as is the terminal ileum. The colon is not abnormally distended. There is there is diverticulosis of the sigmoid and mild thickening of the wall of the mid sigmoid. There is a small amount of free fluid in the right lower quadrant of the pelvis. There is a shallow right inguinal hernia containing fat and fluid but no bowel. The appendix is not discretely demonstrated.  The gallbladder is surgically absent. There is a small amount of fluid adjacent to the dome of the liver. There is no focal hepatic mass. The pancreas, spleen, adrenal glands, and kidneys exhibit no acute abnormalities. The caliber of the abdominal aorta is normal.  The lung bases are clear. The lumbar spine exhibits degenerative disc change at multiple levels. The bony pelvis is unremarkable.  IMPRESSION: 1. There is  a distal small bowel obstruction. The precise location of the obstruction is not clearly evident. There is considerable distention of the stomach. Nasogastric suction would be useful. 2. There is mild sigmoid diverticulosis and subjective mild thickening of the sigmoid colonic wall. One cannot exclude low-grade diverticulitis. There is no discrete abscess though there is a small amount of fluid separate from the sigmoid in the right aspect of the pelvis. 3. The appendix is not discretely demonstrated but there is no pericecal inflammatory change. 4. There is a small right inguinal hernia containing fluid but no bowel. 5. There is no acute hepatobiliary nor acute  urinary tract abnormality. 6. Surgical consultation is recommended. 7. These results were called by telephone at the time of interpretation on 03/06/2014 at 6:37 pm to Dr. Venora Maples, who verbally acknowledged these results.   Electronically Signed   By: David  Martinique   On: 03/06/2014 18:37    Anti-infectives: Anti-infectives   None       Assessment/Plan  1. SBO  Plan: 1. Will repeat films today.  Suspect he will need to keep his NGT today at least given he just had it put in late yesterday. 2. Cont NPO and follow up on films.  3. On daily lovenox/SCDs  LOS: 1 day    Lakima Dona E 03/07/2014, 8:45 AM Pager: [phone]

## 2014-03-08 ENCOUNTER — Inpatient Hospital Stay (HOSPITAL_COMMUNITY): Payer: Medicare Other

## 2014-03-08 NOTE — Progress Notes (Signed)
[facility] Progress Note     Subjective: Pt says he feels great.  No N/V.  Having flatus and had 2 small BM's.  Ambulating in the room.  Hungry/thirsty.    Objective: Vital signs in last 24 hours: Temp:  [98 F (36.7 C)-98.6 F (37 C)] 98.6 F (37 C) (09/22 0500) Pulse Rate:  [75-77] 75 (09/22 0500) Resp:  [16-20] 16 (09/22 0500) BP: (112-120)/(63-70) 112/70 mmHg (09/22 0500) SpO2:  [93 %-97 %] 93 % (09/22 0500) Last BM Date: 03/05/14  Intake/Output from previous day: 09/21 0701 - 09/22 0700 In: 2718.6 [I.V.:2688.6; NG/GT:30] Out: 2100 [Urine:900; Emesis/NG output:1200] Intake/Output this shift: Total I/O In: 0  Out: 175 [Urine:175]  PE: Gen:  Alert, NAD, pleasant Abd: Soft, NT/ND, +BS, no HSM   Lab Results:   Recent Labs  03/06/14 1633 03/07/14 0840  WBC 10.5 7.5  HGB 15.3 13.4  HCT 43.2 38.6*  PLT 227 211   BMET  Recent Labs  03/06/14 1633 03/07/14 0840  NA 136* 135*  K 4.8 4.0  CL 95* 97  CO2 27 27  GLUCOSE 148* 131*  BUN 32* 22  CREATININE 1.35 1.22  CALCIUM 9.5 8.4   PT/INR No results found for this basename: LABPROT, INR,  in the last 72 hours CMP     Component Value Date/Time   NA 135* 03/07/2014 0840   K 4.0 03/07/2014 0840   CL 97 03/07/2014 0840   CO2 27 03/07/2014 0840   GLUCOSE 131* 03/07/2014 0840   BUN 22 03/07/2014 0840   CREATININE 1.22 03/07/2014 0840   CALCIUM 8.4 03/07/2014 0840   PROT 7.9 03/06/2014 1633   ALBUMIN 3.8 03/06/2014 1633   AST 25 03/06/2014 1633   ALT 23 03/06/2014 1633   ALKPHOS 98 03/06/2014 1633   BILITOT 1.1 03/06/2014 1633   GFRNONAA 55* 03/07/2014 0840   GFRAA 63* 03/07/2014 0840   Lipase     Component Value Date/Time   LIPASE 29 03/06/2014 1633       Studies/Results: Ct Abdomen Pelvis W Contrast  03/06/2014   CLINICAL DATA:  Generalized abdominal pain for the past 2 days with nausea and vomiting ; last bowel movement yesterday  EXAM: CT ABDOMEN AND PELVIS WITH CONTRAST  TECHNIQUE:  Multidetector CT imaging of the abdomen and pelvis was performed using the standard protocol following bolus administration of intravenous contrast.  CONTRAST:  162mL OMNIPAQUE IOHEXOL 300 MG/ML SOLN intravenously ; the patient also received oral contrast material.  COMPARISON:  None.  FINDINGS: The stomach is quite distended with food, contrast, and gas. No significant contrast has entered the bowel. The jejunum and ileum are mildly distended with gas and fluid. A small portion of the distal ileum is normal in caliber as is the terminal ileum. The colon is not abnormally distended. There is there is diverticulosis of the sigmoid and mild thickening of the wall of the mid sigmoid. There is a small amount of free fluid in the right lower quadrant of the pelvis. There is a shallow right inguinal hernia containing fat and fluid but no bowel. The appendix is not discretely demonstrated.  The gallbladder is surgically absent. There is a small amount of fluid adjacent to the dome of the liver. There is no focal hepatic mass. The pancreas, spleen, adrenal glands, and kidneys exhibit no acute abnormalities. The caliber of the abdominal aorta is normal.  The lung bases are clear. The lumbar spine exhibits degenerative disc change at multiple levels. The bony  pelvis is unremarkable.  IMPRESSION: 1. There is a distal small bowel obstruction. The precise location of the obstruction is not clearly evident. There is considerable distention of the stomach. Nasogastric suction would be useful. 2. There is mild sigmoid diverticulosis and subjective mild thickening of the sigmoid colonic wall. One cannot exclude low-grade diverticulitis. There is no discrete abscess though there is a small amount of fluid separate from the sigmoid in the right aspect of the pelvis. 3. The appendix is not discretely demonstrated but there is no pericecal inflammatory change. 4. There is a small right inguinal hernia containing fluid but no bowel. 5.  There is no acute hepatobiliary nor acute urinary tract abnormality. 6. Surgical consultation is recommended. 7. These results were called by telephone at the time of interpretation on 03/06/2014 at 6:37 pm to Dr. Venora Maples, who verbally acknowledged these results.   Electronically Signed   By: David  Martinique   On: 03/06/2014 18:37   Dg Abd 2 Views  03/08/2014   CLINICAL DATA:  Small bowel obstruction, weakness, nasogastric tube  EXAM: ABDOMEN - 2 VIEW  COMPARISON:  03/07/2014  FINDINGS: Tip of nasogastric tube projects over the distal second portion of duodenum.  Few mildly dilated small bowel loops noted in LEFT upper quadrant and mid abdomen, decreased in size and number versus previous study.  Small amounts of gas and stool throughout colon to rectum.  No definite bowel wall thickening or free intraperitoneal air.  Surgical clips RIGHT upper quadrant.  Minimal bibasilar atelectasis.  Bones demineralized with degenerative disc disease changes of the lumbar spine.  IMPRESSION: Small bowel obstruction appearing slightly improved since prior exam.   Electronically Signed   By: Lavonia Dana M.D.   On: 03/08/2014 08:23   Dg Abd Portable 2v  03/07/2014   CLINICAL DATA:  Small-bowel obstruction.  NGT placement.  EXAM: PORTABLE ABDOMEN - 2 VIEW  COMPARISON:  CT 03/06/2014.  FINDINGS: NG tube noted with its tip below the left hemidiaphragm. Persistent distention of small bowel loops consistent small bowel obstruction again noted. Colonic gas pattern is nonspecific. Stool is noted right colon. The colon is nondistended. No free air is noted. Degenerative changes lumbar spine. Contrast noted and bladder from prior CT.  IMPRESSION: Scratch  1. NG tube noted with tip below left hemidiaphragm. 2. Persistent prominently distended loops of small bowel consistent small bowel obstruction.   Electronically Signed   By: Marcello Moores  Register   On: 03/07/2014 09:28    Anti-infectives: Anti-infectives   None        Assessment/Plan 1. SBO   Plan:  1. Repeat films improved, but NG with 1200 out - he's drinking water.  Will clamp NG tube since he has had 2 small BM's and flatus.  His NG output is still high but I suspect its due to him drinking water.  His pain is resolved and he denies N/V.  Clamp NG tube. 2. Ice chips and sips of clears ok 3. On daily lovenox/SCDs  4. Encourage ambulation and IS 5. KUB improved today as well as clinically.  Hopefully he's resolving with conservative management alone     LOS: 2 days    Coralie Keens 03/08/2014, 9:38 AM Pager: [phone]

## 2014-03-08 NOTE — Progress Notes (Signed)
More BM Abdomen very soft  Clears Will D/C ng tube  Imogene Burn. Georgette Dover, MD, [facility]  General/ Trauma Surgery  03/08/2014 2:33 PM

## 2014-03-09 NOTE — Progress Notes (Signed)
Discussed discharge summary with patient. Reviewed all medications with patient. Patient doesn't have any symptoms such as n&v or abdominal pain. Patient ready for discharge and has no questions.

## 2014-03-09 NOTE — Care Management Note (Signed)
  Page 1 of 1   03/09/2014     9:43:30 AM CARE MANAGEMENT NOTE 03/09/2014  Patient:  Randall Williams, Randall Williams   Account Number:  [id number]  Date Initiated:  03/09/2014  Documentation initiated by:  Magdalen Spatz  Subjective/Objective Assessment:     Action/Plan:   Anticipated DC Date:  03/09/2014   Anticipated DC Plan:  HOME/SELF CARE         Choice offered to / List presented to:             Status of service:   Medicare Important Message given?  YES (If response is "NO", the following Medicare IM given date fields will be blank) Date Medicare IM given:  03/09/2014 Medicare IM given by:  Magdalen Spatz Date Additional Medicare IM given:   Additional Medicare IM given by:    Discharge Disposition:    Per UR Regulation:    If discussed at Long Length of Stay Meetings, dates discussed:    Comments:

## 2014-03-09 NOTE — Discharge Summary (Signed)
Randall Williams. Georgette Dover, MD, [facility]  General/ Trauma Surgery  03/09/2014 8:45 AM

## 2014-03-09 NOTE — Discharge Summary (Signed)
Patient ID: Randall Williams MRN: [id number] DOB/AGE: [birth year] 80 y.o.  Admit date: 03/06/2014 Discharge date: 03/09/2014  Procedures: none  Consults: None  Reason for Admission: The patient has been sick since Friday evening (2 days ago) with generalized abdominal discomfort and just not feeling well. This persisted until Saturday when he started having more nausea and vomiting. Pain persisted. Came into the ED today where CT scan demonstrated a high grade SBO with an enlarged stomach. Surgery was called  Admission Diagnoses:  1. SBO  Hospital Course: The patient was admitted and an NGT was placed.  He was treated with bowel rest for the first 36 hours.  His repeat films showed improved and he began passing flatus and having BMs.  His NGT was removed and he was given clear liquids.  His diet was then able to be advanced as tolerated.  He was stable on HD 3 for dc home.  PE: Abd: soft, NT, Nd, +BS Heart: regular Lungs: CTAB  Discharge Diagnoses:  Active Problems:   Small bowel obstruction   Discharge Medications:   Medication List    Notice   You have not been prescribed any medications.      Discharge Instructions:     Follow-up Information   Follow up with Nyoka Cowden, MD. (As needed)    Specialty:  Internal Medicine   Contact information:   [place] [address] [phone]       Signed: Henreitta Cea 03/09/2014, 8:29 AM

## 2014-09-14 DIAGNOSIS — H40003 Preglaucoma, unspecified, bilateral: Secondary | ICD-10-CM | POA: Diagnosis not present

## 2014-10-18 ENCOUNTER — Other Ambulatory Visit: Payer: Self-pay | Admitting: Internal Medicine

## 2014-10-18 MED ORDER — ERYTHROMYCIN 2 % EX PADS: MEDICATED_PAD | CUTANEOUS | Status: DC

## 2014-12-29 ENCOUNTER — Telehealth: Payer: Self-pay | Admitting: *Deleted

## 2014-12-29 IMAGING — CT CT ABD-PELV W/ CM
2 of 5 series · 15 of 46 positions shown, 17 images · IV contrast (Omni 300)
Comparison: None.

CLINICAL DATA: Generalized abdominal pain for the past 2 days with
nausea and vomiting ; last bowel movement yesterday

EXAM:
CT ABDOMEN AND PELVIS WITH CONTRAST
TECHNIQUE: Multidetector CT imaging of the abdomen and pelvis was performed
using the standard protocol following bolus administration of
intravenous contrast.
CONTRAST:  100mL OMNIPAQUE IOHEXOL 300 MG/ML SOLN intravenously ;
the patient also received oral contrast material.

[Series 2: abd/ pelvis 5.0 i30f 1 · axial · 0.71mm/px · z∈[-441,-36]mm · 12 of 93 slices shown, 14 images]
[im 6/93  soft-tissue]
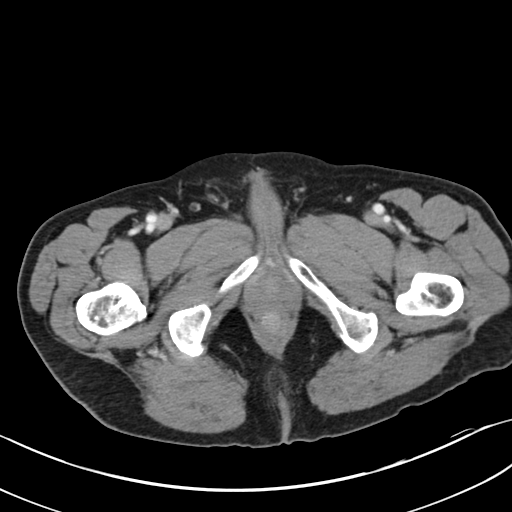
[im 6/93  bone]
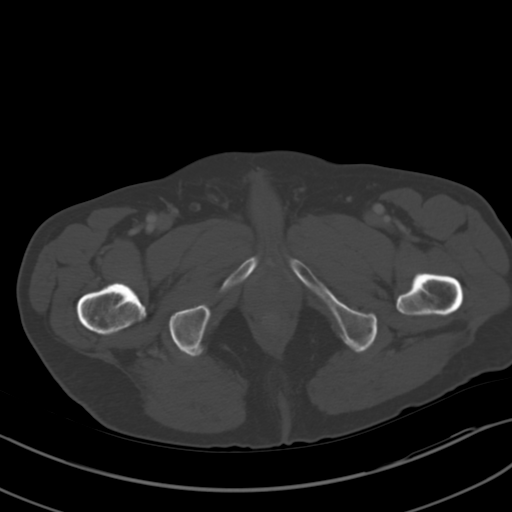
[im 16/93  soft-tissue]
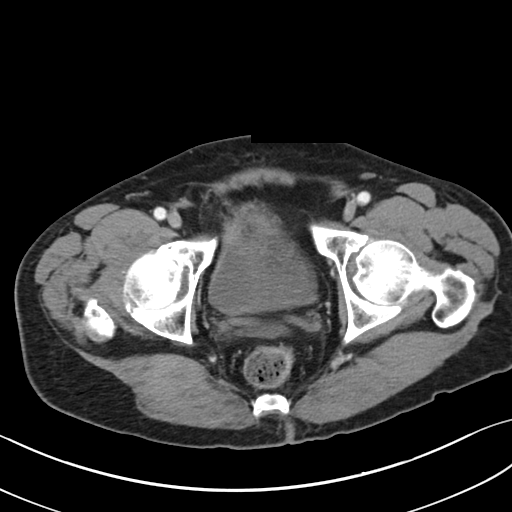
[im 21/93  soft-tissue]
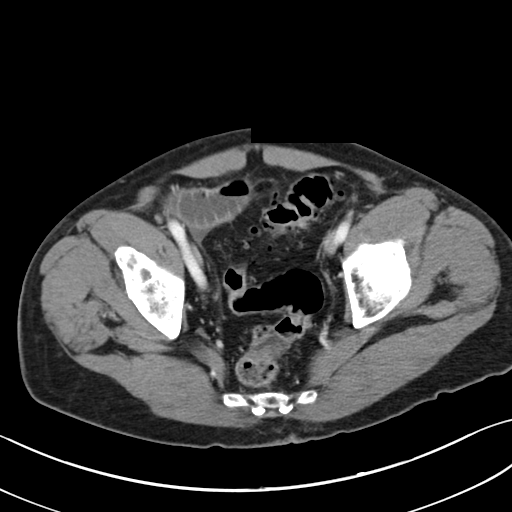
[im 26/93  soft-tissue]
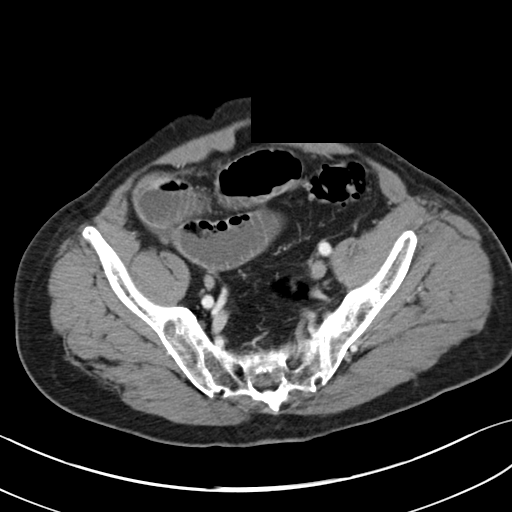
[im 36/93  soft-tissue]
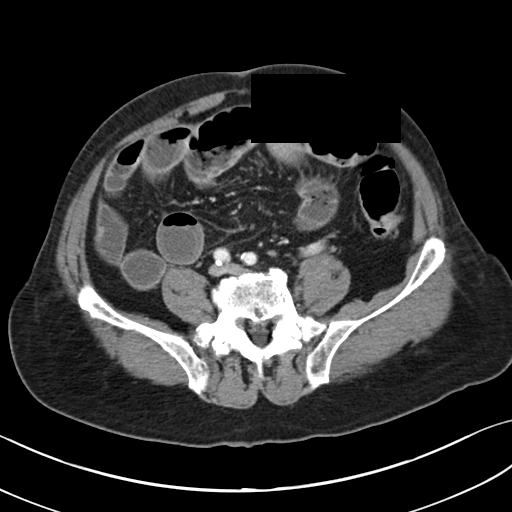
[im 41/93  soft-tissue]
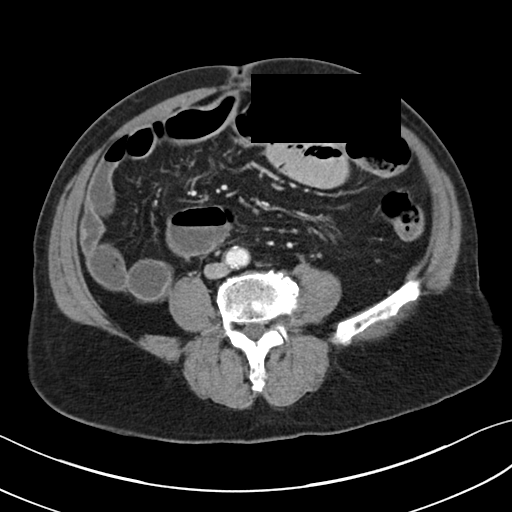
[im 52/93  soft-tissue]
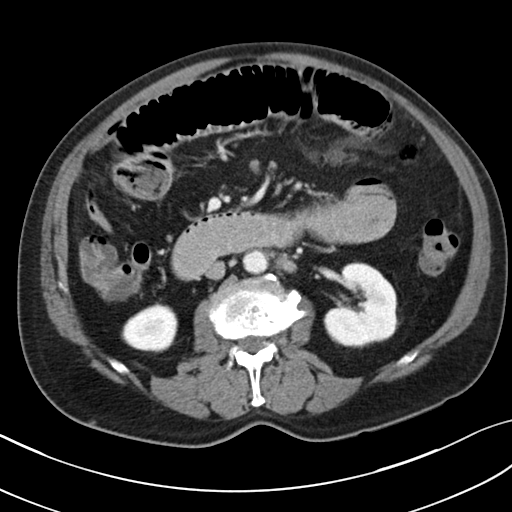
[im 57/93  soft-tissue]
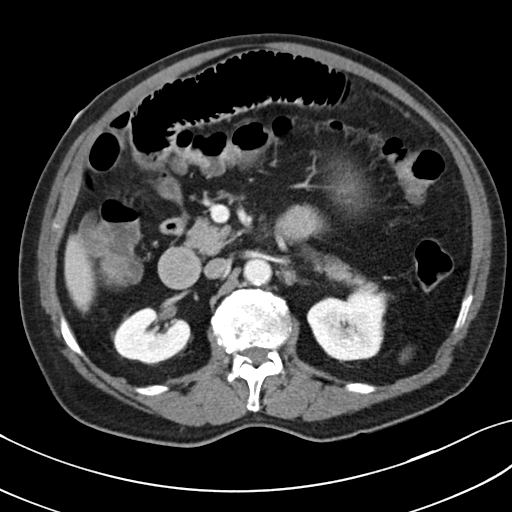
[im 67/93  soft-tissue]
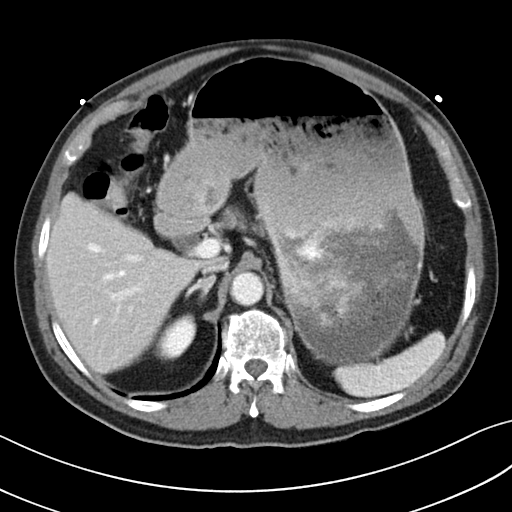
[im 67/93  bone]
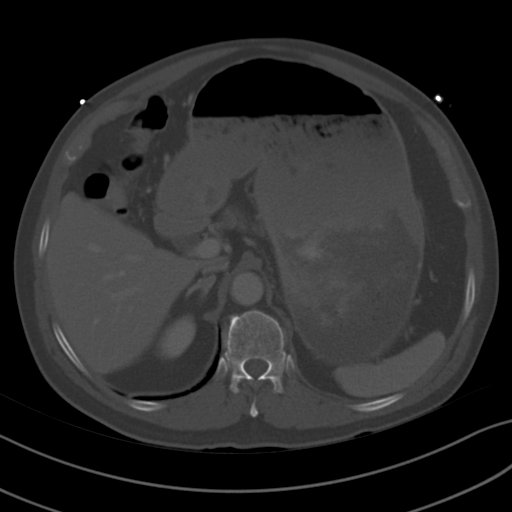
[im 72/93  soft-tissue]
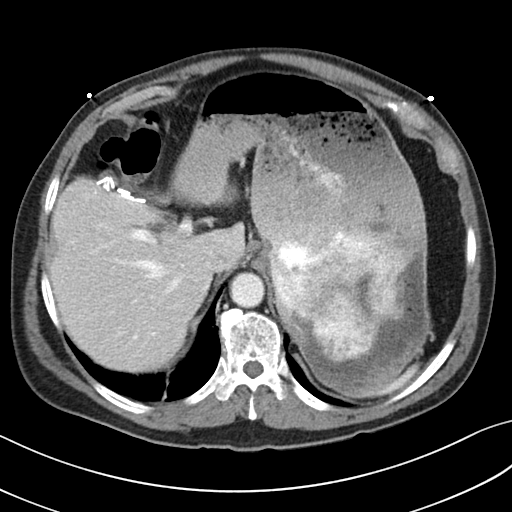
[im 77/93  soft-tissue]
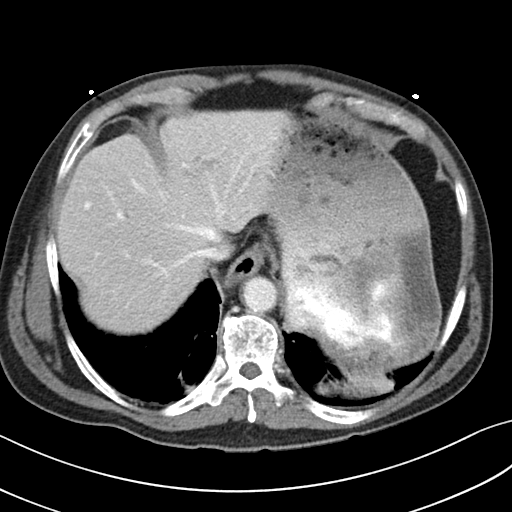
[im 87/93  soft-tissue]
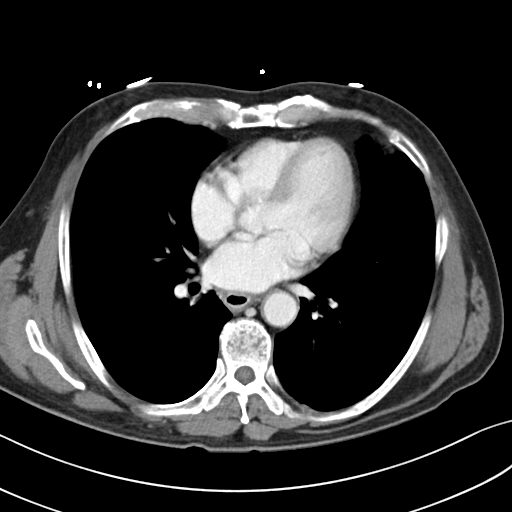

[Series 5: coronals · coronal · 0.76mm/px · 3 of 137 slices shown]
[im 46/137  soft-tissue]
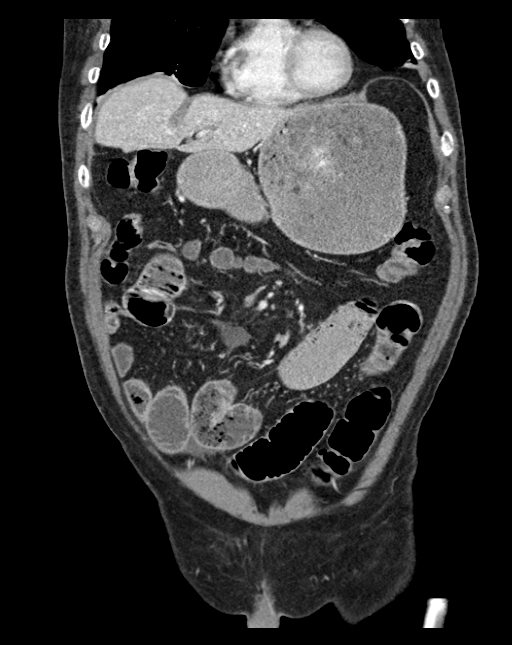
[im 61/137  soft-tissue]
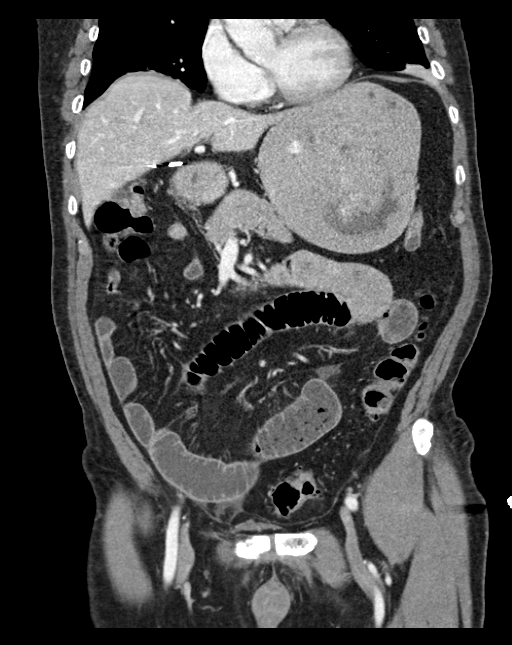
[im 76/137  soft-tissue]
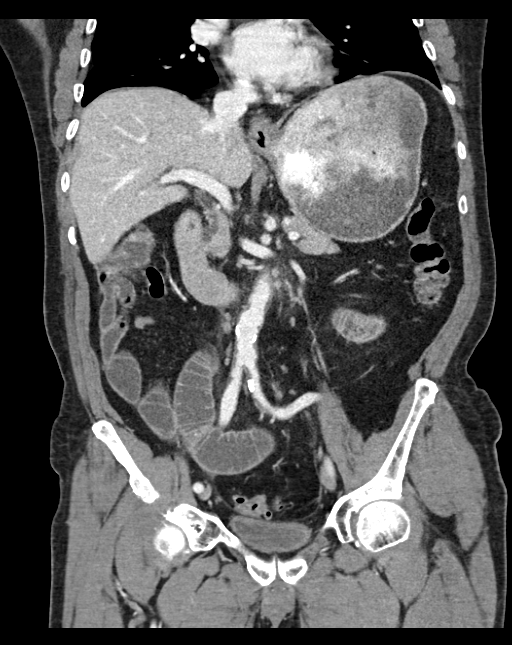

[15 of 46 positions shown; findings below may reference images not displayed]

FINDINGS: The stomach is quite distended with food, contrast, and gas. No
significant contrast has entered the bowel. The jejunum and ileum
are mildly distended with gas and fluid. A small portion of the
distal ileum is normal in caliber as is the terminal ileum. The
colon is not abnormally distended. There is there is diverticulosis
of the sigmoid and mild thickening of the wall of the mid sigmoid.
There is a small amount of free fluid in the right lower quadrant of
the pelvis. There is a shallow right inguinal hernia containing fat
and fluid but no bowel. The appendix is not discretely demonstrated.

The gallbladder is surgically absent. There is a small amount of
fluid adjacent to the dome of the liver. There is no focal hepatic
mass. The pancreas, spleen, adrenal glands, and kidneys exhibit no
acute abnormalities. The caliber of the abdominal aorta is normal.

The lung bases are clear. The lumbar spine exhibits degenerative
disc change at multiple levels. The bony pelvis is unremarkable.
IMPRESSION: 1. There is a distal small bowel obstruction. The precise location
of the obstruction is not clearly evident. There is considerable
distention of the stomach. Nasogastric suction would be useful.
2. There is mild sigmoid diverticulosis and subjective mild
thickening of the sigmoid colonic wall. One cannot exclude low-grade
diverticulitis. There is no discrete abscess though there is a small
amount of fluid separate from the sigmoid in the right aspect of the
pelvis.
3. The appendix is not discretely demonstrated but there is no
pericecal inflammatory change.
4. There is a small right inguinal hernia containing fluid but no
bowel.
5. There is no acute hepatobiliary nor acute urinary tract
abnormality.
6. Surgical consultation is recommended.
7. These results were called by telephone at the time of
interpretation on 03/06/2014 at [DATE] to Dr. Sudamer, who verbally
acknowledged these results.

## 2014-12-29 NOTE — Telephone Encounter (Signed)
Patient walked in complaining of infection on his left big toe/toenail area.  It has happened for about 4 days.  Appointment made.

## 2014-12-30 ENCOUNTER — Encounter: Payer: Self-pay | Admitting: Adult Health

## 2014-12-30 ENCOUNTER — Ambulatory Visit (INDEPENDENT_AMBULATORY_CARE_PROVIDER_SITE_OTHER): Payer: Medicare Other | Admitting: Adult Health

## 2014-12-30 VITALS — BP 130/80 | Temp 98.1°F | Ht 64.0 in | Wt 155.4 lb

## 2014-12-30 DIAGNOSIS — L259 Unspecified contact dermatitis, unspecified cause: Secondary | ICD-10-CM

## 2014-12-30 DIAGNOSIS — B351 Tinea unguium: Secondary | ICD-10-CM

## 2014-12-30 IMAGING — CR DG ABD PORTABLE 2V
2 series · 2 of 2 positions shown · non-contrast
Comparison: CT 03/06/2014.

CLINICAL DATA: Small-bowel obstruction.  NGT placement.

EXAM:
PORTABLE ABDOMEN - 2 VIEW

[supine ap (1 of 2)]
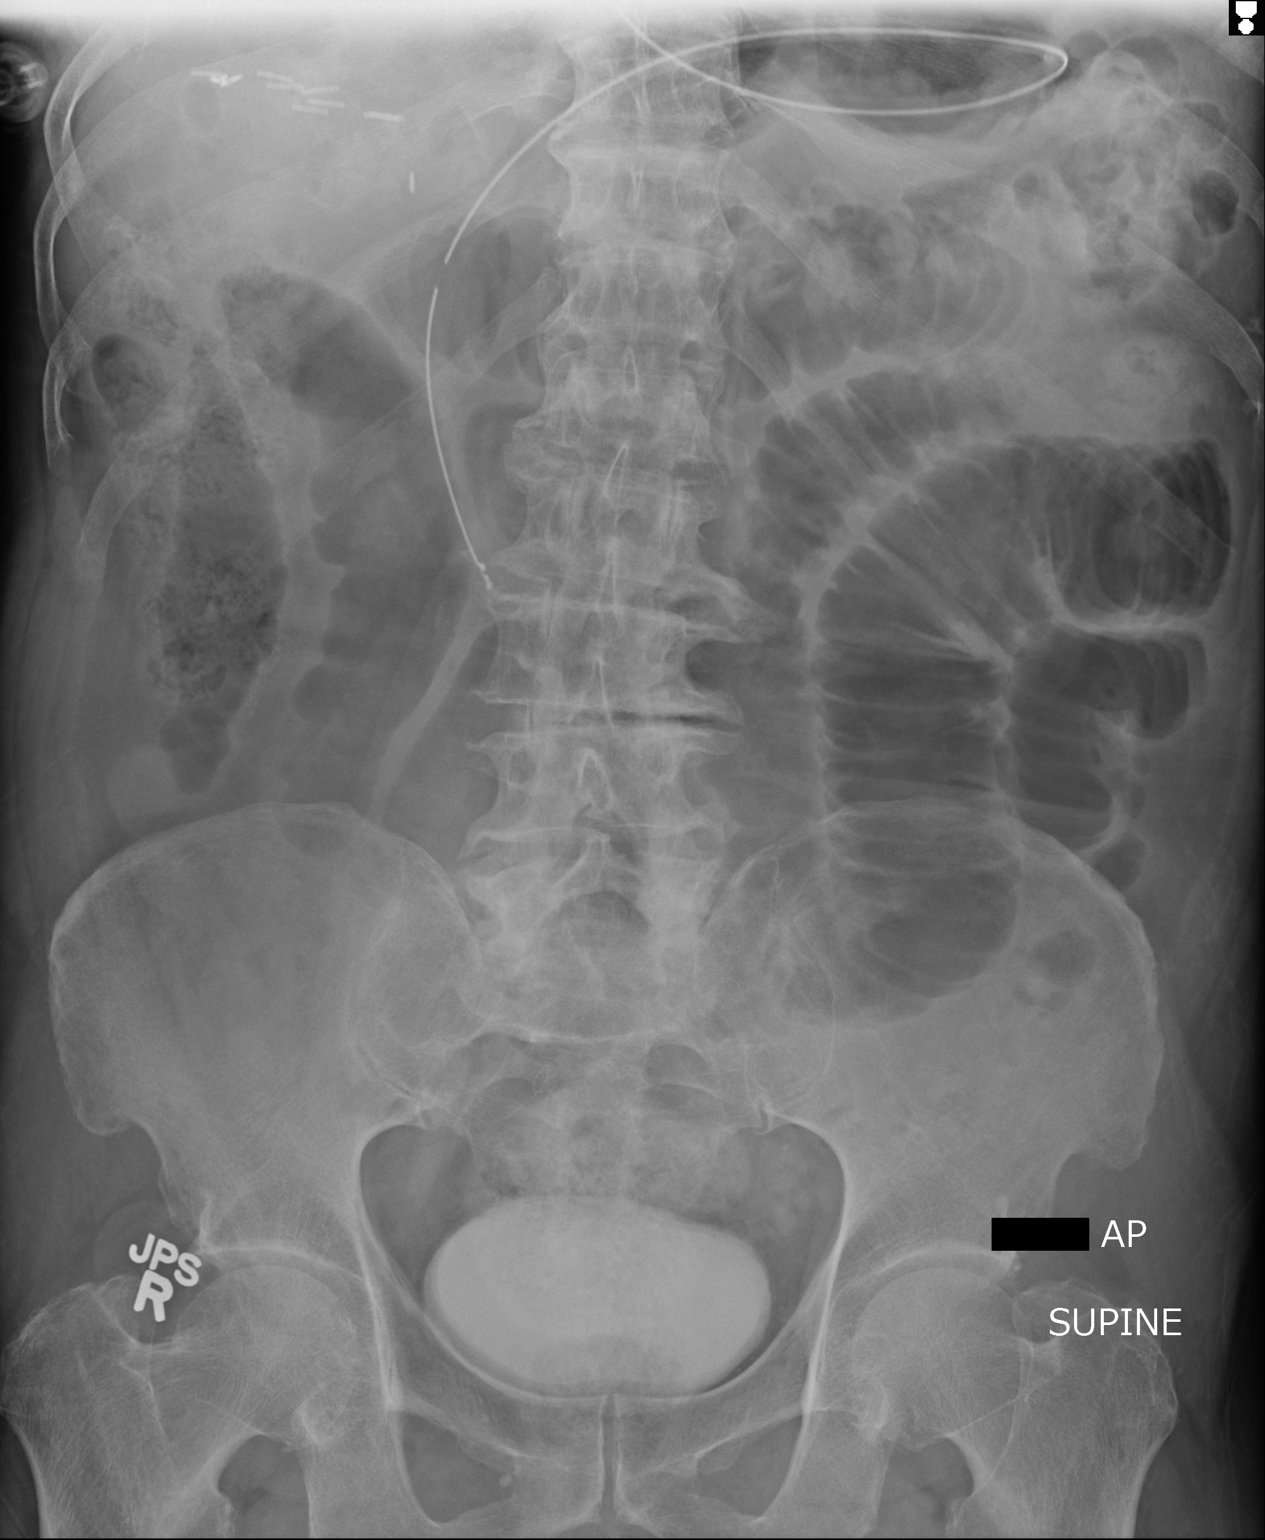

[supine ap (2 of 2)]
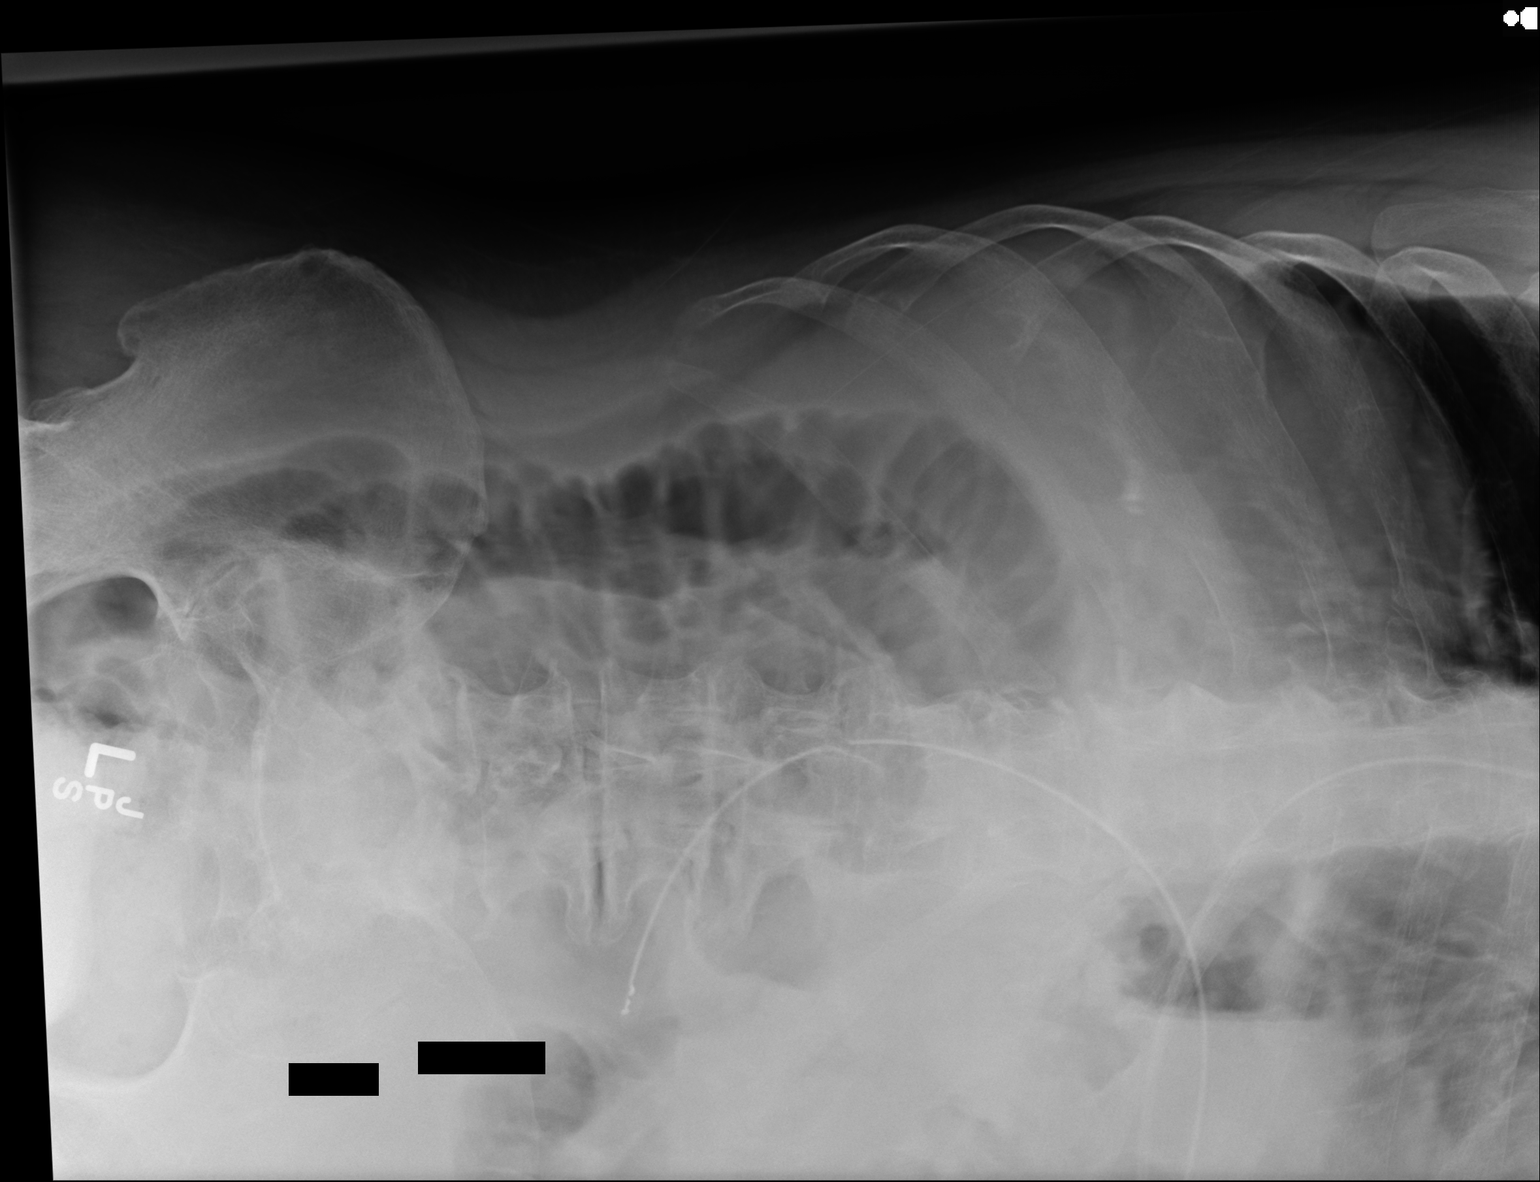

[2 of 2 positions shown; findings below may reference images not displayed]

FINDINGS: NG tube noted with its tip below the left hemidiaphragm. Persistent
distention of small bowel loops consistent small bowel obstruction
again noted. Colonic gas pattern is nonspecific. Stool is noted
right colon. The colon is nondistended. No free air is noted.
Degenerative changes lumbar spine. Contrast noted and bladder from
prior CT.
IMPRESSION: Scratch

1. NG tube noted with tip below left hemidiaphragm.
2. Persistent prominently distended loops of small bowel consistent
small bowel obstruction.

## 2014-12-30 MED ORDER — EFINACONAZOLE 10 % EX SOLN
2.0000 [drp] | Freq: Every day | CUTANEOUS | Status: DC
Start: 2014-12-30 — End: 2016-05-15

## 2014-12-30 MED ORDER — TRIAMCINOLONE ACETONIDE 0.1 % EX CREA: 1.0000 "application " | TOPICAL_CREAM | Freq: Two times a day (BID) | CUTANEOUS | Status: DC

## 2014-12-30 NOTE — Progress Notes (Signed)
   Subjective:    Patient ID: Randall Williams, male    DOB: [birth year], 81 y.o.   MRN: [id number]  HPI  81 year old healthy male, presents to the office today with two complaints.   1) Toe nail fungus on left great toe. Has been placing OTC anti fungals on nail for the last week without any indication of improvement.   2) Rash on right forearm. Works outside a lot. Has not been in any contact with poison ivy that he knows of. Denies any blistering, weeping rash. Rash is no where else on body.    Review of Systems  Constitutional: Negative.   Respiratory: Negative.   Cardiovascular: Negative.   Gastrointestinal: Negative.   Skin: Positive for rash.  Neurological: Negative.   All other systems reviewed and are negative.    Past Medical History  Diagnosis Date  . Lymphoma of lymph nodes of face 2008    History   Social History  . Marital Status: Married    Spouse Name: N/A  . Number of Children: N/A  . Years of Education: N/A   Occupational History  . Not on file.   Social History Main Topics  . Smoking status: Former Smoker    Quit date: 06/17/1968  . Smokeless tobacco: Never Used  . Alcohol Use: No  . Drug Use: No  . Sexual Activity: Not on file   Other Topics Concern  . Not on file   Social History Narrative    Past Surgical History  Procedure Laterality Date  . Laparoscopic cholecystectomy  2005    No family history on file.  No Known Allergies  No current outpatient prescriptions on file prior to visit.   No current facility-administered medications on file prior to visit.    BP 130/80 mmHg  Temp(Src) 98.1 F (36.7 C) (Oral)  Ht 5\' 4"  (1.626 m)  Wt 155 lb 6.4 oz (70.489 kg)  BMI 26.66 kg/m2       Objective:   Physical Exam  Constitutional: He is oriented to person, place, and time. He appears well-developed and well-nourished. No distress.  Abdominal: Soft. Bowel sounds are normal. He exhibits no distension and no mass. There is no  tenderness. There is no rebound and no guarding.  Musculoskeletal: Normal range of motion.  Neurological: He is alert and oriented to person, place, and time.  Skin: Skin is warm and dry. Rash noted. He is not diaphoretic.  Left great toe with foe nail fungus,no swelling or inflammation around nail bed or cuticle. Left toe nail yellow in color.   No other toes involved.   Psychiatric: He has a normal mood and affect. His behavior is normal. Judgment and thought content normal.  Nursing note and vitals reviewed.      Assessment & Plan:   1. Onychomycosis - Reviewed medication options with patient. He would like to try topical medication first.  - Efinaconazole 10 % SOLN; Apply 2 drops topically daily.  Dispense: 8 mL; Refill: 3 - Follow up if no improvement in 2-3 months.   2. Contact dermatitis - triamcinolone cream (KENALOG) 0.1 %; Apply 1 application topically 2 (two) times daily.  Dispense: 30 g; Refill: 1 - Follow up if no improvement in 2-3 days.

## 2014-12-30 NOTE — Patient Instructions (Addendum)
It was great meeting you today.   I have sent in a prescription for the toe nail fungus solution as well the the hydrocortisone cream.   Apply the solution for the toe nail fungus in the morning  Topical: Affected toenail(s) should be clean and dry. Wait at least 10 minutes after showering, bathing, or washing the area prior to application. Remove bottle cap and hold the bottle directly over the affected toenail. Squeeze the bottle and apply one drop onto the toenail. If the great toe is affected, apply a second drop to the end of the toenail. Gently spread the solution completely around the toenail (toenail bed, folds, hyponychium, undersurface of the toenail plate) with the attached applicator brush and let dry thoroughly; wash hands with soap and water after use. Patient should avoid pedicures, use of nail polish, or cosmetic nail products while using the solution.  Follow up if no improvement in 2-3 months with the toe nail fungus.

## 2014-12-30 NOTE — Progress Notes (Signed)
Pre visit review using our clinic review tool, if applicable. No additional management support is needed unless otherwise documented below in the visit note. 

## 2015-02-24 DIAGNOSIS — Z23 Encounter for immunization: Secondary | ICD-10-CM | POA: Diagnosis not present

## 2016-03-05 DIAGNOSIS — Z23 Encounter for immunization: Secondary | ICD-10-CM | POA: Diagnosis not present

## 2016-04-18 DIAGNOSIS — H353190 Nonexudative age-related macular degeneration, unspecified eye, stage unspecified: Secondary | ICD-10-CM | POA: Diagnosis not present

## 2016-04-18 DIAGNOSIS — H40003 Preglaucoma, unspecified, bilateral: Secondary | ICD-10-CM | POA: Diagnosis not present

## 2016-05-02 DIAGNOSIS — H401132 Primary open-angle glaucoma, bilateral, moderate stage: Secondary | ICD-10-CM | POA: Diagnosis not present

## 2016-05-02 DIAGNOSIS — H353190 Nonexudative age-related macular degeneration, unspecified eye, stage unspecified: Secondary | ICD-10-CM | POA: Diagnosis not present

## 2016-05-15 ENCOUNTER — Ambulatory Visit (INDEPENDENT_AMBULATORY_CARE_PROVIDER_SITE_OTHER): Payer: Medicare Other | Admitting: Internal Medicine

## 2016-05-15 ENCOUNTER — Encounter: Payer: Self-pay | Admitting: Internal Medicine

## 2016-05-15 VITALS — BP 130/80 | HR 72 | Temp 98.4°F | Resp 20 | Ht 62.0 in | Wt 155.2 lb

## 2016-05-15 DIAGNOSIS — R7302 Impaired glucose tolerance (oral): Secondary | ICD-10-CM | POA: Diagnosis not present

## 2016-05-15 DIAGNOSIS — Z23 Encounter for immunization: Secondary | ICD-10-CM | POA: Diagnosis not present

## 2016-05-15 DIAGNOSIS — E785 Hyperlipidemia, unspecified: Secondary | ICD-10-CM

## 2016-05-15 DIAGNOSIS — E6609 Other obesity due to excess calories: Secondary | ICD-10-CM | POA: Diagnosis not present

## 2016-05-15 DIAGNOSIS — E039 Hypothyroidism, unspecified: Secondary | ICD-10-CM | POA: Insufficient documentation

## 2016-05-15 DIAGNOSIS — K56609 Unspecified intestinal obstruction, unspecified as to partial versus complete obstruction: Secondary | ICD-10-CM

## 2016-05-15 HISTORY — DX: Hypothyroidism, unspecified: E03.9

## 2016-05-15 LAB — CBC WITH DIFFERENTIAL/PLATELET
BASOS ABS: 0 10*3/uL (ref 0.0–0.1)
Basophils Relative: 0.5 % (ref 0.0–3.0)
EOS ABS: 0.2 10*3/uL (ref 0.0–0.7)
Eosinophils Relative: 2.3 % (ref 0.0–5.0)
HEMATOCRIT: 40.4 % (ref 39.0–52.0)
HEMOGLOBIN: 13.6 g/dL (ref 13.0–17.0)
LYMPHS PCT: 38.8 % (ref 12.0–46.0)
Lymphs Abs: 2.9 10*3/uL (ref 0.7–4.0)
MCHC: 33.6 g/dL (ref 30.0–36.0)
MCV: 91.5 fl (ref 78.0–100.0)
Monocytes Absolute: 0.4 10*3/uL (ref 0.1–1.0)
Monocytes Relative: 5.3 % (ref 3.0–12.0)
Neutro Abs: 3.9 10*3/uL (ref 1.4–7.7)
Neutrophils Relative %: 53.1 % (ref 43.0–77.0)
Platelets: 245 10*3/uL (ref 150.0–400.0)
RBC: 4.42 Mil/uL (ref 4.22–5.81)
RDW: 14.4 % (ref 11.5–15.5)
WBC: 7.4 10*3/uL (ref 4.0–10.5)

## 2016-05-15 LAB — LIPID PANEL
CHOL/HDL RATIO: 6
CHOLESTEROL: 271 mg/dL — AB (ref 0–200)
HDL: 44.3 mg/dL (ref >39.00)
NonHDL: 226.34
Triglycerides: 223 mg/dL — ABNORMAL HIGH (ref 0.0–149.0)
VLDL: 44.6 mg/dL — ABNORMAL HIGH (ref 0.0–40.0)

## 2016-05-15 LAB — COMPREHENSIVE METABOLIC PANEL
ALBUMIN: 3.9 g/dL (ref 3.5–5.2)
ALK PHOS: 82 U/L (ref 39–117)
ALT: 18 U/L (ref 0–53)
AST: 20 U/L (ref 0–37)
BILIRUBIN TOTAL: 0.8 mg/dL (ref 0.2–1.2)
BUN: 22 mg/dL (ref 6–23)
CALCIUM: 9.3 mg/dL (ref 8.4–10.5)
CO2: 28 mEq/L (ref 19–32)
Chloride: 105 mEq/L (ref 96–112)
Creatinine, Ser: 1.33 mg/dL (ref 0.40–1.50)
GFR: 54.77 mL/min — ABNORMAL LOW (ref >60.00)
Glucose, Bld: 109 mg/dL — ABNORMAL HIGH (ref 70–99)
Potassium: 4.4 mEq/L (ref 3.5–5.1)
Sodium: 139 mEq/L (ref 135–145)
Total Protein: 6.9 g/dL (ref 6.0–8.3)

## 2016-05-15 LAB — TSH: TSH: 8.06 u[IU]/mL — AB (ref 0.35–4.50)

## 2016-05-15 LAB — LDL CHOLESTEROL, DIRECT: Direct LDL: 180 mg/dL

## 2016-05-15 NOTE — Progress Notes (Signed)
Pre visit review using our clinic review tool, if applicable. No additional management support is needed unless otherwise documented below in the visit note. 

## 2016-05-15 NOTE — Addendum Note (Signed)
Addended by: Marian Sorrow on: 05/15/2016 01:06 PM   Modules accepted: Orders

## 2016-05-15 NOTE — Progress Notes (Signed)
Subjective:    Patient ID: Randall Williams, male    DOB: [birth year], 82 y.o.   MRN: [id number]  HPI  HPI  82 year-old patient who enjoys remarkably good health who has relocated to the area from [place] approximate 5 years ago. He is a retired Land and enjoys good health. He was hospitalized briefly in 2005 for a cholecystectomy otherwise no hospitalizations until 2015 when he was hospitalized for a few days for a small bowel obstruction which was treated medically. He has no medical illnesses and takes no chronic medications.  No concerns or complaints Prevnar 13 administered   Medicare wellness exam  1. Risk factors, based on past  M,S,F history  no cardiovascular risk factors 2.  Physical activities: Remains quite active with a regular exercise program. Actually played soccer occasionally.  [place] to [facility] [employer] 3-4 times per week   3.  Depression/mood: No history depression or mood disorder  4.  Hearing: No deficits  5.  ADL's: Independent in all aspects of daily living  6.  Fall risk: Low  7.  Home safety: no problems identified  8.  Height weight, and visual acuity; height and weight stable no change in visual acuity.  Has had a recent eye examination  9.  Counseling: Heart healthy diet regular exercise both encouraged 10. Lab orders based on risk factors: Will check laboratory panel  11.  Referral.  Not appropriate at this time 12.  Care plan: Not appropriate at this time  13. Cognitive assessment: Alert and oriented with normal affect. No cognitive dysfunction.   Family history unremarkable father died at 38 of complications of pneumonia mother died at 64 5 brothers 15 sisters in good health Born and raised in [place]   Review of Systems  Constitutional: Negative for activity change, appetite change, chills, fatigue and fever.  HENT: Negative for congestion, dental problem, ear pain, hearing loss, mouth sores, rhinorrhea, sinus pressure,  sneezing, tinnitus, trouble swallowing and voice change.   Eyes: Negative for photophobia, pain, redness and visual disturbance.  Respiratory: Negative for apnea, cough, choking, chest tightness, shortness of breath and wheezing.   Cardiovascular: Negative for chest pain, palpitations and leg swelling.  Gastrointestinal: Negative for abdominal distention, abdominal pain, anal bleeding, blood in stool, constipation, diarrhea, nausea, rectal pain and vomiting.  Genitourinary: Negative for decreased urine volume, difficulty urinating, discharge, dysuria, flank pain, frequency, genital sores, hematuria, penile swelling, scrotal swelling, testicular pain and urgency.       Nocturia times 2  Musculoskeletal: Negative for arthralgias, back pain, gait problem, joint swelling, myalgias, neck pain and neck stiffness.  Skin: Negative for color change, rash and wound.  Neurological: Negative for dizziness, tremors, seizures, syncope, facial asymmetry, speech difficulty, weakness, light-headedness, numbness and headaches.  Hematological: Negative for adenopathy. Does not bruise/bleed easily.  Psychiatric/Behavioral: Negative for agitation, behavioral problems, confusion, decreased concentration, dysphoric mood, hallucinations, self-injury, sleep disturbance and suicidal ideas. The patient is not nervous/anxious.        Objective:   Physical Exam  Constitutional: He appears well-developed and well-nourished.  Blood pressure 124/80 both arms  HENT:  Head: Normocephalic and atraumatic.  Right Ear: External ear normal.  Left Ear: External ear normal.  Nose: Nose normal.  Mouth/Throat: Oropharynx is clear and moist.  Low hanging soft palate  Eyes: Conjunctivae and EOM are normal. Pupils are equal, round, and reactive to light. No scleral icterus.  Neck: Normal range of motion. Neck supple. No JVD present. No thyromegaly present.  Cardiovascular: Regular rhythm, normal heart sounds and intact distal pulses.   Exam reveals no gallop and no friction rub.   No murmur heard. Posterior tibial pulses faint  Pulmonary/Chest: Effort normal and breath sounds normal. He exhibits no tenderness.  Abdominal: Soft. Bowel sounds are normal. He exhibits no distension and no mass. There is no tenderness.  Cholecystectomy scarring  Genitourinary: Penis normal. Rectal exam shows guaiac negative stool.  Genitourinary Comments: Prostate symmetrically enlarged  Musculoskeletal: Normal range of motion. He exhibits no edema or tenderness.  Lymphadenopathy:    He has no cervical adenopathy.  Neurological: He is alert. He has normal reflexes. No cranial nerve deficit. Coordination normal.  Skin: Skin is warm and dry. No rash noted.  Psychiatric: He has a normal mood and affect. His behavior is normal.          Assessment & Plan:   History small bowel obstruction Medicare wellness exam Status post cholecystectomy  Prevnar 13 administered Check updated lab  Recheck one year or as needed  [employer]

## 2016-05-15 NOTE — Patient Instructions (Signed)
Limit your sodium (Salt) intake    It is important that you exercise regularly, at least 20 minutes 3 to 4 times per week.  If you develop chest pain or shortness of breath seek  medical attention.  You need to lose weight.  Consider a lower calorie diet and regular exercise.  Return in one year for follow-up 

## 2016-05-17 ENCOUNTER — Other Ambulatory Visit: Payer: Self-pay | Admitting: *Deleted

## 2016-05-17 ENCOUNTER — Telehealth: Payer: Self-pay | Admitting: Internal Medicine

## 2016-05-17 MED ORDER — LEVOTHYROXINE SODIUM 25 MCG PO TABS: 25.0000 ug | ORAL_TABLET | Freq: Every day | ORAL | 1 refills | Status: DC

## 2016-05-17 NOTE — Telephone Encounter (Signed)
See result note.  

## 2016-05-17 NOTE — Telephone Encounter (Signed)
Pt calling to get lab results.

## 2016-05-20 ENCOUNTER — Other Ambulatory Visit: Payer: Self-pay | Admitting: Internal Medicine

## 2016-05-20 DIAGNOSIS — E039 Hypothyroidism, unspecified: Secondary | ICD-10-CM

## 2016-06-26 ENCOUNTER — Encounter: Payer: Medicare Other | Admitting: Internal Medicine

## 2017-02-20 DIAGNOSIS — Z23 Encounter for immunization: Secondary | ICD-10-CM | POA: Diagnosis not present

## 2017-02-25 ENCOUNTER — Encounter: Payer: Self-pay | Admitting: Internal Medicine

## 2017-02-25 ENCOUNTER — Other Ambulatory Visit: Payer: Self-pay | Admitting: Internal Medicine

## 2017-02-25 ENCOUNTER — Ambulatory Visit (INDEPENDENT_AMBULATORY_CARE_PROVIDER_SITE_OTHER): Payer: Medicare Other | Admitting: Internal Medicine

## 2017-02-25 VITALS — BP 124/70 | HR 75 | Temp 98.0°F | Ht 62.0 in | Wt 155.0 lb

## 2017-02-25 DIAGNOSIS — H6123 Impacted cerumen, bilateral: Secondary | ICD-10-CM | POA: Diagnosis not present

## 2017-02-25 MED ORDER — ERYTHROMYCIN 2 % EX PADS: 1.0000 "application " | MEDICATED_PAD | Freq: Two times a day (BID) | CUTANEOUS | 3 refills | Status: DC

## 2017-02-25 MED ORDER — CARBAMIDE PEROXIDE 6.5 % OT SOLN: 5.0000 [drp] | Freq: Once | OTIC | Status: DC

## 2017-02-25 NOTE — Progress Notes (Signed)
   Subjective:    Patient ID: Randall Williams, male    DOB: [birth year], 83 y.o.   MRN: [id number]  HPI 83 year old patient who has a history of hypothyroidism.  He presents today complaining of decreased auditory acuity from primarily the left ear Denies any ear pain  Past Medical History:  Diagnosis Date  . 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 Marital status: Married    Spouse name: N/A  . Number of children: N/A  . Years of education: N/A   Occupational History  . Not on file.   Social History Main Topics  . Smoking status: Former Smoker    Quit date: 06/17/1968  . Smokeless tobacco: Never Used  . Alcohol use No  . Drug use: No  . Sexual activity: Not on file   Other Topics Concern  . Not on file   Social History Narrative  . No narrative on file    Past Surgical History:  Procedure Laterality Date  . LAPAROSCOPIC CHOLECYSTECTOMY  2005    No family history on file.  Allergies  Allergen Reactions  . Synthroid [Levothyroxine Sodium] Hives and Rash    Current Outpatient Prescriptions on File Prior to Visit  Medication Sig Dispense Refill  . levothyroxine (SYNTHROID, LEVOTHROID) 25 MCG tablet Take 1 tablet (25 mcg total) by mouth daily before breakfast. (Patient not taking: Reported on 02/25/2017) 90 tablet 1  . triamcinolone cream (KENALOG) 0.1 % Apply 1 application topically 2 (two) times daily. (Patient not taking: Reported on 02/25/2017) 30 g 1   No current facility-administered medications on file prior to visit.     BP 124/70 (BP Location: Left Arm, Patient Position: Sitting, Cuff Size: Normal)   Pulse 75   Temp 98 F (36.7 C) (Oral)   Ht 5\' 2"  (1.575 m)   Wt 155 lb (70.3 kg)   SpO2 95%   BMI 28.35 kg/m      Review of Systems  Constitutional: Negative for appetite change, chills, fatigue and fever.  HENT: Positive for hearing loss. Negative for congestion, dental problem, ear pain, sore throat,  tinnitus, trouble swallowing and voice change.   Eyes: Negative for pain, discharge and visual disturbance.  Respiratory: Negative for cough, chest tightness, wheezing and stridor.   Cardiovascular: Negative for chest pain, palpitations and leg swelling.  Gastrointestinal: Negative for abdominal distention, abdominal pain, blood in stool, constipation, diarrhea, nausea and vomiting.  Genitourinary: Negative for difficulty urinating, discharge, flank pain, genital sores, hematuria and urgency.  Musculoskeletal: Negative for arthralgias, back pain, gait problem, joint swelling, myalgias and neck stiffness.  Skin: Negative for rash.  Neurological: Negative for dizziness, syncope, speech difficulty, weakness, numbness and headaches.  Hematological: Negative for adenopathy. Does not bruise/bleed easily.  Psychiatric/Behavioral: Negative for behavioral problems and dysphoric mood. The patient is not nervous/anxious.        Objective:   Physical Exam  Constitutional: He appears well-developed and well-nourished. No distress.  HENT:  Both canals filled with cerumen          Assessment & Plan:   Bilateral cerumen impactions.  Both canals irrigated until clear  Nyoka Cowden

## 2017-02-25 NOTE — Addendum Note (Signed)
Addended by: Abelardo Diesel on: 02/25/2017 05:10 PM   Modules accepted: Orders

## 2017-02-25 NOTE — Patient Instructions (Addendum)
Earwax Buildup, Adult The ears produce a substance called earwax that helps keep bacteria out of the ear and protects the skin in the ear canal. Occasionally, earwax can build up in the ear and cause discomfort or hearing loss. What increases the risk? This condition is more likely to develop in people who:  Are male.  Are elderly.  Naturally produce more earwax.  Clean their ears often with cotton swabs.  Use earplugs often.  Use in-ear headphones often.  Wear hearing aids.  Have narrow ear canals.  Have earwax that is overly thick or sticky.  Have eczema.  Are dehydrated.  Have excess hair in the ear canal.  What are the signs or symptoms? Symptoms of this condition include:  Reduced or muffled hearing.  A feeling of fullness in the ear or feeling that the ear is plugged.  Fluid coming from the ear.  Ear pain.  Ear itch.  Ringing in the ear.  Coughing.  An obvious piece of earwax that can be seen inside the ear canal.  How is this diagnosed? This condition may be diagnosed based on:  Your symptoms.  Your medical history.  An ear exam. During the exam, your health care provider will look into your ear with an instrument called an otoscope.  You may have tests, including a hearing test. How is this treated? This condition may be treated by:  Using ear drops to soften the earwax.  Having the earwax removed by a health care provider. The health care provider may: ? Flush the ear with water. ? Use an instrument that has a loop on the end (curette). ? Use a suction device.  Surgery to remove the wax buildup. This may be done in severe cases.  Follow these instructions at home:  Take over-the-counter and prescription medicines only as told by your health care provider.  Do not put any objects, including cotton swabs, into your ear. You can clean the opening of your ear canal with a washcloth or facial tissue.  Follow instructions from your health  care provider about cleaning your ears. Do not over-clean your ears.  Drink enough fluid to keep your urine clear or pale yellow. This will help to thin the earwax.  Keep all follow-up visits as told by your health care provider. If earwax builds up in your ears often or if you use hearing aids, consider seeing your health care provider for routine, preventive ear cleanings. Ask your health care provider how often you should schedule your cleanings.  If you have hearing aids, clean them according to instructions from the manufacturer and your health care provider. Contact a health care provider if:  You have ear pain.  You develop a fever.  You have blood, pus, or other fluid coming from your ear.  You have hearing loss.  You have ringing in your ears that does not go away.  Your symptoms do not improve with treatment.  You feel like the room is spinning (vertigo). Summary  Earwax can build up in the ear and cause discomfort or hearing loss.  The most common symptoms of this condition include reduced or muffled hearing and a feeling of fullness in the ear or feeling that the ear is plugged.  This condition may be diagnosed based on your symptoms, your medical history, and an ear exam.  This condition may be treated by using ear drops to soften the earwax or by having the earwax removed by a health care provider.  Do   not put any objects, including cotton swabs, into your ear. You can clean the opening of your ear canal with a washcloth or facial tissue. This information is not intended to replace advice given to you by your health care provider. Make sure you discuss any questions you have with your health care provider. Document Released: 07/11/2004 Document Revised: 08/14/2016 Document Reviewed: 08/14/2016 Elsevier Interactive Patient Education  2018 [employer]vier Inc[employer]  

## 2017-03-06 ENCOUNTER — Encounter: Payer: Self-pay | Admitting: Internal Medicine

## 2017-06-16 DIAGNOSIS — H353190 Nonexudative age-related macular degeneration, unspecified eye, stage unspecified: Secondary | ICD-10-CM | POA: Diagnosis not present

## 2017-06-16 DIAGNOSIS — H40003 Preglaucoma, unspecified, bilateral: Secondary | ICD-10-CM | POA: Diagnosis not present

## 2017-11-17 ENCOUNTER — Encounter: Payer: Self-pay | Admitting: Internal Medicine

## 2017-11-17 ENCOUNTER — Ambulatory Visit (INDEPENDENT_AMBULATORY_CARE_PROVIDER_SITE_OTHER): Payer: Medicare Other | Admitting: Internal Medicine

## 2017-11-17 VITALS — BP 116/62 | HR 71 | Temp 98.2°F | Wt 156.0 lb

## 2017-11-17 DIAGNOSIS — H9193 Unspecified hearing loss, bilateral: Secondary | ICD-10-CM | POA: Diagnosis not present

## 2017-11-17 DIAGNOSIS — E039 Hypothyroidism, unspecified: Secondary | ICD-10-CM

## 2017-11-17 DIAGNOSIS — K56609 Unspecified intestinal obstruction, unspecified as to partial versus complete obstruction: Secondary | ICD-10-CM

## 2017-11-17 LAB — COMPREHENSIVE METABOLIC PANEL
ALT: 13 U/L (ref 0–53)
AST: 17 U/L (ref 0–37)
Albumin: 3.9 g/dL (ref 3.5–5.2)
Alkaline Phosphatase: 87 U/L (ref 39–117)
BILIRUBIN TOTAL: 0.5 mg/dL (ref 0.2–1.2)
BUN: 23 mg/dL (ref 6–23)
CO2: 28 meq/L (ref 19–32)
CREATININE: 1.37 mg/dL (ref 0.40–1.50)
Calcium: 9.5 mg/dL (ref 8.4–10.5)
Chloride: 104 mEq/L (ref 96–112)
GFR: 52.73 mL/min — ABNORMAL LOW (ref >60.00)
GLUCOSE: 108 mg/dL — AB (ref 70–99)
Potassium: 5.1 mEq/L (ref 3.5–5.1)
Sodium: 136 mEq/L (ref 135–145)
TOTAL PROTEIN: 6.9 g/dL (ref 6.0–8.3)

## 2017-11-17 LAB — CBC WITH DIFFERENTIAL/PLATELET
BASOS ABS: 0 10*3/uL (ref 0.0–0.1)
Basophils Relative: 0.7 % (ref 0.0–3.0)
Eosinophils Absolute: 0.1 10*3/uL (ref 0.0–0.7)
Eosinophils Relative: 1.5 % (ref 0.0–5.0)
HCT: 39.6 % (ref 39.0–52.0)
Hemoglobin: 13.4 g/dL (ref 13.0–17.0)
Lymphocytes Relative: 37.4 % (ref 12.0–46.0)
Lymphs Abs: 2.5 10*3/uL (ref 0.7–4.0)
MCHC: 33.9 g/dL (ref 30.0–36.0)
MCV: 92.4 fl (ref 78.0–100.0)
MONO ABS: 0.4 10*3/uL (ref 0.1–1.0)
Monocytes Relative: 6.1 % (ref 3.0–12.0)
NEUTROS ABS: 3.6 10*3/uL (ref 1.4–7.7)
Neutrophils Relative %: 54.3 % (ref 43.0–77.0)
Platelets: 242 10*3/uL (ref 150.0–400.0)
RBC: 4.29 Mil/uL (ref 4.22–5.81)
RDW: 13.5 % (ref 11.5–15.5)
WBC: 6.6 10*3/uL (ref 4.0–10.5)

## 2017-11-17 LAB — TSH: TSH: 10.2 u[IU]/mL — AB (ref 0.35–4.50)

## 2017-11-17 NOTE — Patient Instructions (Signed)
Return in 6 months for an annual exam  Please call for audiology referral if hearing worsens

## 2017-11-17 NOTE — Progress Notes (Signed)
Subjective:    Patient ID: Randall Williams, male    DOB: [birth year], 84 y.o.   MRN: [id number]  HPI  84 year old patient who is seen infrequently with a chief complaint today of decrease in auditory acuity. He was last seen about 9 months ago and did have bilateral cerumen impactions cleared at that time He complains of requiring higher TV volume and difficult to carry on conversations with people who speaks very softly.  He has difficulty hearing with ambient noise  He has a history of hypothyroidism but has not been on any medication for a number of months.  Past Medical History:  Diagnosis Date  . Lymphoma of lymph nodes of face ([place]) 2008     Social History   Socioeconomic History  . Marital status: Married    Spouse name: Not on file  . Number of children: Not on file  . Years of education: Not on file  . Highest education level: Not on file  Occupational History  . Not on file  Social Needs  . Financial resource strain: Not on file  . Food insecurity:    Worry: Not on file    Inability: Not on file  . Transportation needs:    Medical: Not on file    Non-medical: Not on file  Tobacco Use  . Smoking status: Former Smoker    Last attempt to quit: 06/17/1968    Years since quitting: 49.4  . Smokeless tobacco: Never Used  Substance and Sexual Activity  . Alcohol use: No  . Drug use: No  . Sexual activity: Not on file  Lifestyle  . Physical activity:    Days per week: Not on file    Minutes per session: Not on file  . Stress: Not on file  Relationships  . Social connections:    Talks on phone: Not on file    Gets together: Not on file    Attends religious service: Not on file    Active member of club or organization: Not on file    Attends meetings of clubs or organizations: Not on file    Relationship status: Not on file  . Intimate partner violence:    Fear of current or ex partner: Not on file    Emotionally abused: Not on file    Physically abused: Not on  file    Forced sexual activity: Not on file  Other Topics Concern  . Not on file  Social History Narrative  . Not on file    Past Surgical History:  Procedure Laterality Date  . LAPAROSCOPIC CHOLECYSTECTOMY  2005    History reviewed. No pertinent family history.  Allergies  Allergen Reactions  . Synthroid [Levothyroxine Sodium] Hives and Rash    Current Outpatient Medications on File Prior to Visit  Medication Sig Dispense Refill  . levothyroxine (SYNTHROID, LEVOTHROID) 25 MCG tablet Take 1 tablet (25 mcg total) by mouth daily before breakfast. (Patient not taking: Reported on 02/25/2017) 90 tablet 1  . triamcinolone cream (KENALOG) 0.1 % Apply 1 application topically 2 (two) times daily. (Patient not taking: Reported on 02/25/2017) 30 g 1   Current Facility-Administered Medications on File Prior to Visit  Medication Dose Route Frequency Provider Last Rate Last Dose  . carbamide peroxide (DEBROX) 6.5 % OTIC (EAR) solution 5 drop  5 drop Both EARS Once Marletta Lor, MD        BP 116/62 (BP Location: Right Arm, Patient Position: Sitting, Cuff Size: Normal)   Pulse  71   Temp 98.2 F (36.8 C) (Oral)   Wt 156 lb (70.8 kg)   SpO2 97%   BMI 28.53 kg/m     Review of Systems  Constitutional: Negative for appetite change, chills, fatigue and fever.  HENT: Positive for hearing loss. Negative for congestion, dental problem, ear pain, sore throat, tinnitus, trouble swallowing and voice change.   Eyes: Negative for pain, discharge and visual disturbance.  Respiratory: Negative for cough, chest tightness, wheezing and stridor.   Cardiovascular: Negative for chest pain, palpitations and leg swelling.  Gastrointestinal: Negative for abdominal distention, abdominal pain, blood in stool, constipation, diarrhea, nausea and vomiting.  Genitourinary: Negative for difficulty urinating, discharge, flank pain, genital sores, hematuria and urgency.  Musculoskeletal: Negative for  arthralgias, back pain, gait problem, joint swelling, myalgias and neck stiffness.  Skin: Negative for rash.  Neurological: Negative for dizziness, syncope, speech difficulty, weakness, numbness and headaches.  Hematological: Negative for adenopathy. Does not bruise/bleed easily.  Psychiatric/Behavioral: Negative for behavioral problems and dysphoric mood. The patient is not nervous/anxious.        Objective:   Physical Exam  Constitutional: He is oriented to person, place, and time. He appears well-developed.  HENT:  Head: Normocephalic.  Right Ear: External ear normal.  Left Ear: External ear normal.  No wax in the canals Weber does not lateralize Hearing more diminished on the left compared to the right  Eyes: Conjunctivae and EOM are normal.  Neck: Normal range of motion.  Cardiovascular: Normal rate and normal heart sounds.  Pulmonary/Chest: Breath sounds normal.  Abdominal: Bowel sounds are normal.  Musculoskeletal: Normal range of motion. He exhibits no edema or tenderness.  Neurological: He is alert and oriented to person, place, and time.  Psychiatric: He has a normal mood and affect. His behavior is normal.          Assessment & Plan:   Decreased auditory acuity.  Audiology referral discussed.  He wishes to defer at this time Hypothyroidism.  Will check a TSH.  He has been off medication for several months History of small bowel obstruction.  Asymptomatic History of cerumen impactions.  Canals are clear  CPX 6 months Check lab today including TSH  Nyoka Cowden

## 2017-11-18 ENCOUNTER — Other Ambulatory Visit: Payer: Self-pay | Admitting: Internal Medicine

## 2017-11-18 MED ORDER — LEVOTHYROXINE SODIUM 25 MCG PO TABS: 25.0000 ug | ORAL_TABLET | Freq: Every day | ORAL | 4 refills | Status: DC

## 2017-11-18 NOTE — Progress Notes (Deleted)
Pt came in for labs due to not understanding what I was saying. Pt verbalized understanding subjectively looks a little confused. I asked pt if he understood to continue Levothyroxine and recheck TSH in 6 weeks he stated yes again. No further action needed.

## 2018-02-20 DIAGNOSIS — Z23 Encounter for immunization: Secondary | ICD-10-CM | POA: Diagnosis not present

## 2018-08-10 ENCOUNTER — Encounter: Payer: Self-pay | Admitting: Family Medicine

## 2018-08-10 ENCOUNTER — Ambulatory Visit (INDEPENDENT_AMBULATORY_CARE_PROVIDER_SITE_OTHER): Payer: Medicare Other | Admitting: Family Medicine

## 2018-08-10 VITALS — BP 118/68 | HR 82 | Temp 98.2°F | Ht 62.0 in | Wt 155.4 lb

## 2018-08-10 DIAGNOSIS — J3489 Other specified disorders of nose and nasal sinuses: Secondary | ICD-10-CM

## 2018-08-10 DIAGNOSIS — Z8639 Personal history of other endocrine, nutritional and metabolic disease: Secondary | ICD-10-CM | POA: Diagnosis not present

## 2018-08-10 DIAGNOSIS — N183 Chronic kidney disease, stage 3 unspecified: Secondary | ICD-10-CM

## 2018-08-10 DIAGNOSIS — Z6828 Body mass index (BMI) 28.0-28.9, adult: Secondary | ICD-10-CM | POA: Diagnosis not present

## 2018-08-10 DIAGNOSIS — E038 Other specified hypothyroidism: Secondary | ICD-10-CM | POA: Insufficient documentation

## 2018-08-10 DIAGNOSIS — N184 Chronic kidney disease, stage 4 (severe): Secondary | ICD-10-CM | POA: Insufficient documentation

## 2018-08-10 NOTE — Assessment & Plan Note (Signed)
Stable.  Check C met with next blood draw.  Avoid nephrotoxic medications.

## 2018-08-10 NOTE — Assessment & Plan Note (Signed)
No symptoms of Synthroid.  He will follow-up in 6 to 12 months.  Recheck TSH and free T4 with next blood draw.

## 2018-08-10 NOTE — Patient Instructions (Signed)
It was very nice to see you today!  Please try using flonase for your phlegm production.  Come back in 6-12 months for your next visit with blood work, or sooner as needed.  Take care, Dr Jerline Pain

## 2018-08-10 NOTE — Progress Notes (Signed)
   Chief Complaint:  Randall Williams is a 85 y.o. male who presents for same day appointment with a chief complaint of sinus congestion and to transfer care  Assessment/Plan:  Rhinorrhea Likely allergic rhinitis.  No red flags.  Recommended over-the-counter Flonase.  Consider Atrovent nasal spray and/or oral antihistamine if no improvement with above.  Discussed reasons to return to care.  BMI 28 Discussed lifestyle modifications.  History of hypothyroidism No symptoms of Synthroid.  He will follow-up in 6 to 12 months.  Recheck TSH and free T4 with next blood draw.   CKD (chronic kidney disease) stage 3, GFR 30-59 ml/min (HCC) Stable.  Check C met with next blood draw.  Avoid nephrotoxic medications.     Subjective:  HPI:  Sinus Congestion, acute problem Started 2-3 months ago.  Stable over that time.  No medications tried. Constant in nature. No pain. No cough. No fevers or chills. No other obvious alleviating or aggravating factors.   His stable, chronic medical conditions are outlined below:  # History of hypothyroidism - Was previously on synthroid but is no longer on any replacement.   # CKD Stage 3 - Avoids NSAIDs.  ROS: Per HPI  PMH: He reports that he quit smoking about 50 years ago. He has never used smokeless tobacco. He reports that he does not drink alcohol or use drugs.      Objective:  Physical Exam: BP 118/68 (BP Location: Left Arm, Patient Position: Sitting, Cuff Size: Normal)   Pulse 82   Temp 98.2 F (36.8 C) (Oral)   Ht _0  (1.575 m)   Wt 155 lb 6.4 oz (70.5 kg)   SpO2 96%   BMI 28.42 kg/m   Gen: NAD, resting comfortably HEENT: TMs clear.  Nasal mucosa erythematous and boggy bilaterally with clear discharge. CV: Regular rate and rhythm with no murmurs appreciated Pulm: Normal work of breathing, clear to auscultation bilaterally with no crackles, wheezes, or rhonchi GI: Normal bowel sounds present. Soft, Nontender, Nondistended. MSK: No edema,  cyanosis, or clubbing noted Skin: Warm, dry Neuro: Grossly normal, moves all extremities Psych: Normal affect and thought content      M. Jerline Pain, MD 08/10/2018 3:01 PM

## 2019-01-13 ENCOUNTER — Other Ambulatory Visit: Payer: Self-pay

## 2019-02-25 DIAGNOSIS — Z23 Encounter for immunization: Secondary | ICD-10-CM | POA: Diagnosis not present

## 2019-08-13 ENCOUNTER — Ambulatory Visit: Payer: Medicare Other | Attending: Internal Medicine

## 2019-08-13 DIAGNOSIS — Z23 Encounter for immunization: Secondary | ICD-10-CM | POA: Insufficient documentation

## 2019-08-13 NOTE — Progress Notes (Signed)
   Covid-19 Vaccination Clinic  Name:  Yuven Sahota    MRN: [id number] DOB: Jul 24, 1932  08/13/2019  Mr. Greiff was observed post Covid-19 immunization for 15 minutes without incidence. He was provided with Vaccine Information Sheet and instruction to access the V-Safe system.   Mr. Neault was instructed to call 911 with any severe reactions post vaccine: Marland Kitchen Difficulty breathing  . Swelling of your face and throat  . A fast heartbeat  . A bad rash all over your body  . Dizziness and weakness    Immunizations Administered    Name Date Dose VIS Date Route   Pfizer COVID-19 Vaccine 08/13/2019  9:19 AM 0.3 mL 05/28/2019 Intramuscular   Manufacturer: [place]   Lot: [id number]   [place]: [id number]

## 2019-09-07 ENCOUNTER — Ambulatory Visit: Payer: Medicare Other | Attending: Internal Medicine

## 2019-09-07 DIAGNOSIS — Z23 Encounter for immunization: Secondary | ICD-10-CM

## 2019-09-07 NOTE — Progress Notes (Signed)
   Covid-19 Vaccination Clinic  Name:  Randall Williams    MRN: [id number] DOB: [birth year]  09/07/2019  [name] was observed post Covid-19 immunization for 15 minutes without incident. He was provided with Vaccine Information Sheet and instruction to access the V-Safe system.   [name] was instructed to call 911 with any severe reactions post vaccine: Marland Kitchen Difficulty breathing  . Swelling of face and throat  . A fast heartbeat  . A bad rash all over body  . Dizziness and weakness   Immunizations Administered    Name Date Dose VIS Date Route   Pfizer COVID-19 Vaccine 09/07/2019 11:10 AM 0.3 mL 05/28/2019 Intramuscular   Manufacturer: [place]   Lot: [id number]   [place]: [id number]

## 2019-09-30 ENCOUNTER — Telehealth: Payer: Self-pay | Admitting: Family Medicine

## 2019-09-30 NOTE — Telephone Encounter (Signed)
I left a message asking the pt to call and schedule AWV-S with Loma Sousa and follow up visit w/ Dr. Jerline Pain.

## 2019-11-04 ENCOUNTER — Encounter: Payer: Self-pay | Admitting: Family Medicine

## 2019-11-04 ENCOUNTER — Other Ambulatory Visit: Payer: Self-pay

## 2019-11-04 ENCOUNTER — Ambulatory Visit (INDEPENDENT_AMBULATORY_CARE_PROVIDER_SITE_OTHER): Payer: Medicare Other | Admitting: Family Medicine

## 2019-11-04 VITALS — BP 142/80 | HR 72 | Temp 98.3°F | Ht 62.0 in | Wt 153.6 lb

## 2019-11-04 DIAGNOSIS — H9193 Unspecified hearing loss, bilateral: Secondary | ICD-10-CM

## 2019-11-04 DIAGNOSIS — L719 Rosacea, unspecified: Secondary | ICD-10-CM | POA: Insufficient documentation

## 2019-11-04 MED ORDER — ERYTHROMYCIN 2 % EX PADS: MEDICATED_PAD | CUTANEOUS | 0 refills | Status: DC

## 2019-11-04 MED ORDER — MINOCYCLINE HCL 100 MG PO CAPS: 100.0000 mg | ORAL_CAPSULE | Freq: Two times a day (BID) | ORAL | 0 refills | Status: DC

## 2019-11-04 NOTE — Patient Instructions (Signed)
It was very nice to see you today!  We will place a referral for you to see the audiologist.  I will refill your other medications.  Take care, Dr Jerline Pain  Please try these tips to maintain a healthy lifestyle:   Eat at least 3 REAL meals and 1-2 snacks per day.  Aim for no more than 5 hours between eating.  If you eat breakfast, please do so within one hour of getting up.    Each meal should contain half fruits/vegetables, one quarter protein, and one quarter carbs (no bigger than a computer mouse)   Cut down on sweet beverages. This includes juice, soda, and sweet tea.     Drink at least 1 glass of water with each meal and aim for at least 8 glasses per day   Exercise at least 150 minutes every week.

## 2019-11-04 NOTE — Assessment & Plan Note (Signed)
Mild flare.  Will give 10-day course of minocycline has this has worked well for him in the past.  We will also refill erythromycin pads.

## 2019-11-04 NOTE — Progress Notes (Signed)
   Randall Williams is a 86 y.o. male who presents today for an office visit.  Assessment/Plan:  New/Acute Problems: Hearing loss We will place referral to audiology.  Chronic Problems Addressed Today: Rosacea Mild flare.  Will give 10-day course of minocycline has this has worked well for him in the past.  We will also refill erythromycin pads.     Subjective:  HPI:  Patient here with mild flare of rosacea.  Located on bilateral cheeks and nose.  Has been on minocycline and erythromycin past which is worked well.  He would like refill today.  He has also noticed decreased hearing.  He would like to be referred for a hearing aid.       Objective:  Physical Exam: BP (!) 142/80 (BP Location: Left Arm, Patient Position: Sitting, Cuff Size: Normal)   Pulse 72   Temp 98.3 F (36.8 C) (Temporal)   Ht 5\' 2"  (1.575 m)   Wt 153 lb 9.6 oz (69.7 kg)   SpO2 97%   BMI 28.09 kg/m   Gen: No acute distress, resting comfortably HEENT: TMs clear bilaterally. CV: Regular rate and rhythm with no murmurs appreciated Pulm: Normal work of breathing, clear to auscultation bilaterally with no crackles, wheezes, or rhonchi Skin: Bilateral cheeks and nose with erythema and faint telangiectasias. Neuro: Grossly normal, moves all extremities Psych: Normal affect and thought content      Annalisia Ingber M. Jerline Pain, MD 11/04/2019 2:41 PM

## 2020-02-25 DIAGNOSIS — Z23 Encounter for immunization: Secondary | ICD-10-CM | POA: Diagnosis not present

## 2020-04-29 ENCOUNTER — Ambulatory Visit: Payer: Medicare Other | Attending: Internal Medicine

## 2020-04-29 DIAGNOSIS — Z23 Encounter for immunization: Secondary | ICD-10-CM

## 2020-04-29 NOTE — Progress Notes (Signed)
° °  Covid-19 Vaccination Clinic  Name:  Fern Asmar    MRN: [id number] DOB: 1932-12-31  04/29/2020  Mr. Sonneborn was observed post Covid-19 immunization for 15 minutes without incident. He was provided with Vaccine Information Sheet and instruction to access the V-Safe system.   Mr. Nicholl was instructed to call 911 with any severe reactions post vaccine:  Difficulty breathing   Swelling of face and throat   A fast heartbeat   A bad rash all over body   Dizziness and weakness   Immunizations Administered    Name Date Dose VIS Date Route   Pfizer COVID-19 Vaccine 04/29/2020  1:04 PM 0.3 mL 04/05/2020 Intramuscular   Manufacturer: [place]   Lot: [id number]   [place]: [id number]

## 2020-06-28 ENCOUNTER — Other Ambulatory Visit: Payer: Self-pay

## 2020-06-28 ENCOUNTER — Encounter: Payer: Self-pay | Admitting: Family Medicine

## 2020-06-28 ENCOUNTER — Ambulatory Visit (INDEPENDENT_AMBULATORY_CARE_PROVIDER_SITE_OTHER): Payer: Medicare Other | Admitting: Family Medicine

## 2020-06-28 VITALS — BP 168/77 | HR 82 | Temp 96.3°F | Ht 62.0 in

## 2020-06-28 DIAGNOSIS — E785 Hyperlipidemia, unspecified: Secondary | ICD-10-CM | POA: Insufficient documentation

## 2020-06-28 DIAGNOSIS — Z8639 Personal history of other endocrine, nutritional and metabolic disease: Secondary | ICD-10-CM | POA: Diagnosis not present

## 2020-06-28 DIAGNOSIS — N183 Chronic kidney disease, stage 3 unspecified: Secondary | ICD-10-CM | POA: Diagnosis not present

## 2020-06-28 DIAGNOSIS — H6983 Other specified disorders of Eustachian tube, bilateral: Secondary | ICD-10-CM | POA: Diagnosis not present

## 2020-06-28 LAB — COMPREHENSIVE METABOLIC PANEL
ALT: 15 U/L (ref 0–53)
AST: 17 U/L (ref 0–37)
Albumin: 3.5 g/dL (ref 3.5–5.2)
Alkaline Phosphatase: 79 U/L (ref 39–117)
BUN: 25 mg/dL — ABNORMAL HIGH (ref 6–23)
CO2: 27 mEq/L (ref 19–32)
Calcium: 8.9 mg/dL (ref 8.4–10.5)
Chloride: 105 mEq/L (ref 96–112)
Creatinine, Ser: 1.71 mg/dL — ABNORMAL HIGH (ref 0.40–1.50)
GFR: 36 mL/min — ABNORMAL LOW (ref >60.00)
Glucose, Bld: 100 mg/dL — ABNORMAL HIGH (ref 70–99)
Potassium: 4.2 mEq/L (ref 3.5–5.1)
Sodium: 137 mEq/L (ref 135–145)
Total Bilirubin: 0.6 mg/dL (ref 0.2–1.2)
Total Protein: 6.5 g/dL (ref 6.0–8.3)

## 2020-06-28 LAB — CBC
HCT: 35.1 % — ABNORMAL LOW (ref 39.0–52.0)
Hemoglobin: 11.9 g/dL — ABNORMAL LOW (ref 13.0–17.0)
MCHC: 33.8 g/dL (ref 30.0–36.0)
MCV: 89 fl (ref 78.0–100.0)
Platelets: 265 10*3/uL (ref 150.0–400.0)
RBC: 3.95 Mil/uL — ABNORMAL LOW (ref 4.22–5.81)
RDW: 14.4 % (ref 11.5–15.5)
WBC: 7.8 10*3/uL (ref 4.0–10.5)

## 2020-06-28 LAB — LIPID PANEL
Cholesterol: 322 mg/dL — ABNORMAL HIGH (ref 0–200)
HDL: 45.9 mg/dL (ref >39.00)
NonHDL: 276.51
Total CHOL/HDL Ratio: 7
Triglycerides: 297 mg/dL — ABNORMAL HIGH (ref 0.0–149.0)
VLDL: 59.4 mg/dL — ABNORMAL HIGH (ref 0.0–40.0)

## 2020-06-28 LAB — T4, FREE: Free T4: 0.72 ng/dL (ref 0.60–1.60)

## 2020-06-28 LAB — LDL CHOLESTEROL, DIRECT: Direct LDL: 198 mg/dL

## 2020-06-28 LAB — TSH: TSH: 9.91 u[IU]/mL — ABNORMAL HIGH (ref 0.35–4.50)

## 2020-06-28 MED ORDER — AZELASTINE HCL 0.1 % NA SOLN: 2.0000 | Freq: Two times a day (BID) | NASAL | 12 refills | Status: DC

## 2020-06-28 NOTE — Progress Notes (Signed)
   Randall Williams is a 87 y.o. male who presents today for an office visit.  Assessment/Plan:  New/Acute Problems: Eustachian tube dysfunction Start Astelin nasal spray.  Recommended over-the-counter antihistamine as well.  He will let me know if not improving.  Chronic Problems Addressed Today: Dyslipidemia Check lipids, CBC, c-Met, TSH.    CKD (chronic kidney disease) stage 3, GFR 30-59 ml/min (HCC) Check CMET.  History of hypothyroidism   Check TSH and free T4.     Subjective:  HPI:  Patient here with numbness to his ears for the last couple of days.  No reported hearing changes.  Said he had something similar about 30 years ago he needed his ears flushed out.  He did not have blood work done today.       Objective:  Physical Exam: BP (!) 168/77   Pulse 82   Temp (!) 96.3 F (35.7 C) (Temporal)   Ht $R'5\' 2"'Ix$  (1.575 m)   SpO2 97%   BMI 28.09 kg/m   Gen: No acute distress, resting comfortably HEENT: TMs with clear effusion.  Very minor amount of cerumen in bilateral EACs. CV: Regular rate and rhythm with no murmurs appreciated Pulm: Normal work of breathing, clear to auscultation bilaterally with no crackles, wheezes, or rhonchi Neuro: Grossly normal, moves all extremities Psych: Normal affect and thought content      Mekhi Sonn M. Jerline Pain, MD 06/28/2020 10:50 AM

## 2020-06-28 NOTE — Assessment & Plan Note (Signed)
Check TSH and free T4 

## 2020-06-28 NOTE — Patient Instructions (Signed)
It was very nice to see you today!  You  did not have any earwax causing a blockage.  You have a little bit of fluid buildup behind your ears.  Please try the nasal spray to clear this out.  We will check blood work today.  Let me know if your symptoms are not improving over the next week or so.  Take care, Dr Jerline Pain  Please try these tips to maintain a healthy lifestyle:   Eat at least 3 REAL meals and 1-2 snacks per day.  Aim for no more than 5 hours between eating.  If you eat breakfast, please do so within one hour of getting up.    Each meal should contain half fruits/vegetables, one quarter protein, and one quarter carbs (no bigger than a computer mouse)   Cut down on sweet beverages. This includes juice, soda, and sweet tea.     Drink at least 1 glass of water with each meal and aim for at least 8 glasses per day   Exercise at least 150 minutes every week.

## 2020-06-28 NOTE — Assessment & Plan Note (Signed)
Check lipids, CBC, c-Met, TSH.   

## 2020-06-28 NOTE — Assessment & Plan Note (Signed)
Check CMET. 

## 2020-06-29 NOTE — Progress Notes (Signed)
Please inform patient of the following:  His kidney function is down a bit compared to last time but oeverall stable. Would like for him to make sure he is getting plenty of fluids and we can recheck again in a couple of weeks.  Randall Williams. Jerline Pain, MD 06/29/2020 8:10 AM

## 2020-07-11 ENCOUNTER — Telehealth: Payer: Self-pay | Admitting: *Deleted

## 2020-07-11 MED ORDER — ATORVASTATIN CALCIUM 40 MG PO TABS: 40.0000 mg | ORAL_TABLET | Freq: Every day | ORAL | 3 refills | Status: DC

## 2020-07-11 NOTE — Telephone Encounter (Signed)
Called and spoke with pt and lipitor sent in.

## 2020-07-11 NOTE — Telephone Encounter (Signed)
We can start lipitor 40mg  daily if he wishes. It will probably help his numbers but not sure if it will help much with preventing heart attacks. Please send in if he is willing to start.   Algis Greenhouse. Jerline Pain, MD 07/11/2020 12:52 PM

## 2020-07-11 NOTE — Telephone Encounter (Signed)
Patient call with lab question  His cholesterol is elevated, wanted to know if he needs to start taking cholesterol medication.

## 2020-09-27 ENCOUNTER — Other Ambulatory Visit: Payer: Self-pay

## 2020-09-27 ENCOUNTER — Ambulatory Visit: Payer: Medicare Other | Attending: Internal Medicine

## 2020-09-27 DIAGNOSIS — Z23 Encounter for immunization: Secondary | ICD-10-CM

## 2020-09-27 NOTE — Progress Notes (Signed)
   Covid-19 Vaccination Clinic  Name:  Randall Williams    MRN: [id number] DOB: 12/18/1932  09/27/2020  Mr. Astarita was observed post Covid-19 immunization for 15 minutes without incident. He was provided with Vaccine Information Sheet and instruction to access the V-Safe system.   Mr. Mutschler was instructed to call 911 with any severe reactions post vaccine: Marland Kitchen Difficulty breathing  . Swelling of face and throat  . A fast heartbeat  . A bad rash all over body  . Dizziness and weakness   Immunizations Administered    Name Date Dose VIS Date Route   PFIZER Comrnaty(Gray TOP) Covid-19 Vaccine 09/27/2020  1:07 PM 0.3 mL 05/25/2020 Intramuscular   Manufacturer: [place]   Lot: [id number]   NDC: [id number]

## 2020-10-02 ENCOUNTER — Other Ambulatory Visit (HOSPITAL_BASED_OUTPATIENT_CLINIC_OR_DEPARTMENT_OTHER): Payer: Self-pay

## 2020-10-02 MED ORDER — COVID-19 MRNA VACCINE (PFIZER) 30 MCG/0.3ML IM SUSP
INTRAMUSCULAR | 5 refills | Status: DC
  Filled 2020-10-02: qty 0.3, 1d supply, fill #0

## 2020-10-11 ENCOUNTER — Ambulatory Visit (INDEPENDENT_AMBULATORY_CARE_PROVIDER_SITE_OTHER): Payer: Medicare Other | Admitting: Family Medicine

## 2020-10-11 ENCOUNTER — Other Ambulatory Visit: Payer: Self-pay

## 2020-10-11 ENCOUNTER — Encounter: Payer: Self-pay | Admitting: Family Medicine

## 2020-10-11 VITALS — BP 118/64 | HR 65 | Temp 97.4°F | Ht 62.0 in | Wt 143.2 lb

## 2020-10-11 DIAGNOSIS — H6123 Impacted cerumen, bilateral: Secondary | ICD-10-CM | POA: Diagnosis not present

## 2020-10-11 DIAGNOSIS — N183 Chronic kidney disease, stage 3 unspecified: Secondary | ICD-10-CM | POA: Diagnosis not present

## 2020-10-11 DIAGNOSIS — E785 Hyperlipidemia, unspecified: Secondary | ICD-10-CM

## 2020-10-11 LAB — LIPID PANEL
Cholesterol: 214 mg/dL — ABNORMAL HIGH (ref 0–200)
HDL: 45.8 mg/dL (ref >39.00)
NonHDL: 167.93
Total CHOL/HDL Ratio: 5
Triglycerides: 299 mg/dL — ABNORMAL HIGH (ref 0.0–149.0)
VLDL: 59.8 mg/dL — ABNORMAL HIGH (ref 0.0–40.0)

## 2020-10-11 LAB — BASIC METABOLIC PANEL
BUN: 30 mg/dL — ABNORMAL HIGH (ref 6–23)
CO2: 25 mEq/L (ref 19–32)
Calcium: 8.8 mg/dL (ref 8.4–10.5)
Chloride: 106 mEq/L (ref 96–112)
Creatinine, Ser: 1.73 mg/dL — ABNORMAL HIGH (ref 0.40–1.50)
GFR: 35.43 mL/min — ABNORMAL LOW (ref >60.00)
Glucose, Bld: 90 mg/dL (ref 70–99)
Potassium: 4.2 mEq/L (ref 3.5–5.1)
Sodium: 137 mEq/L (ref 135–145)

## 2020-10-11 LAB — LDL CHOLESTEROL, DIRECT: Direct LDL: 133 mg/dL

## 2020-10-11 NOTE — Progress Notes (Signed)
   Randall Williams is a 87 y.o. male who presents today for an office visit.  Assessment/Plan:  New/Acute Problems: Cerumen impaction Successfully irrigated by CMA.  He tolerated well without complication.  Recommended Debrox as needed to prevent buildup.  Chronic Problems Addressed Today: Dyslipidemia He is on Lipitor 40 mg daily.  We will recheck lipids today per patient request.  CKD (chronic kidney disease) stage 3, GFR 30-59 ml/min (HCC) Avoid nephrotoxic medications.  Check BMET.    Subjective:  HPI:  See A/P for status of chronic conditions.  Patient has had issues with hearing loss.  Went to audiology center and was told he had COVID infection.  Would like to have this ears flushed out today.       Objective:  Physical Exam: BP 118/64   Pulse 65   Temp (!) 97.4 F (36.3 C)   Ht 5\' 2"  (1.575 m)   Wt 143 lb 4 oz (65 kg)   SpO2 97%   BMI 26.20 kg/m   Gen: No acute distress, resting comfortably HEENT: Bilateral TMs obscured by cerumen.  After successful irrigation by CMA TMs were visualized without abnormality.  Neuro: Grossly normal, moves all extremities Psych: Normal affect and thought content      Randall Renstrom M. Jerline Pain, MD 10/11/2020 11:36 AM

## 2020-10-11 NOTE — Assessment & Plan Note (Signed)
He is on Lipitor 40 mg daily.  We will recheck lipids today per patient request.

## 2020-10-11 NOTE — Assessment & Plan Note (Signed)
Avoid nephrotoxic medications.  Check BMET.

## 2020-10-11 NOTE — Patient Instructions (Signed)
It was very nice to see you today!  We cleaned out your ears today.  You can use Debrox as needed to prevent buildup.  We will check blood work today.  No medication changes.  Take care, Dr Jerline Pain  PLEASE NOTE:  If you had any lab tests please let us know if you have not heard back within a few days. You may see your results on mychart before we have a chance to review them but we will give you a call once they are reviewed by [place]. If we ordered any referrals today, please let us know if you have not heard from their office within the next week.   Please try these tips to maintain a healthy lifestyle:   Eat at least 3 REAL meals and 1-2 snacks per day.  Aim for no more than 5 hours between eating.  If you eat breakfast, please do so within one hour of getting up.    Each meal should contain half fruits/vegetables, one quarter protein, and one quarter carbs (no bigger than a computer mouse)   Cut down on sweet beverages. This includes juice, soda, and sweet tea.     Drink at least 1 glass of water with each meal and aim for at least 8 glasses per day   Exercise at least 150 minutes every week.

## 2020-10-12 NOTE — Progress Notes (Signed)
Please inform patient of the following:  Cholesterol levels much better.  Kidney function stable.  Would like for him to continue his current treatment plan to continue working on diet and exercise.  We can recheck in about a year.

## 2021-03-02 DIAGNOSIS — Z23 Encounter for immunization: Secondary | ICD-10-CM | POA: Diagnosis not present

## 2021-04-05 DIAGNOSIS — H2513 Age-related nuclear cataract, bilateral: Secondary | ICD-10-CM | POA: Diagnosis not present

## 2021-04-05 DIAGNOSIS — H353190 Nonexudative age-related macular degeneration, unspecified eye, stage unspecified: Secondary | ICD-10-CM | POA: Diagnosis not present

## 2021-04-05 DIAGNOSIS — H401132 Primary open-angle glaucoma, bilateral, moderate stage: Secondary | ICD-10-CM | POA: Diagnosis not present

## 2021-05-03 DIAGNOSIS — H401132 Primary open-angle glaucoma, bilateral, moderate stage: Secondary | ICD-10-CM | POA: Diagnosis not present

## 2021-05-08 ENCOUNTER — Ambulatory Visit (INDEPENDENT_AMBULATORY_CARE_PROVIDER_SITE_OTHER): Payer: Medicare Other | Admitting: Family

## 2021-05-08 ENCOUNTER — Encounter: Payer: Self-pay | Admitting: Family

## 2021-05-08 ENCOUNTER — Other Ambulatory Visit: Payer: Self-pay

## 2021-05-08 VITALS — BP 140/80 | HR 79 | Temp 97.9°F | Ht 62.0 in | Wt 141.8 lb

## 2021-05-08 DIAGNOSIS — R350 Frequency of micturition: Secondary | ICD-10-CM | POA: Insufficient documentation

## 2021-05-08 LAB — POCT URINALYSIS DIPSTICK
Bilirubin, UA: NEGATIVE
Blood, UA: POSITIVE
Glucose, UA: NEGATIVE
Ketones, UA: NEGATIVE
Leukocytes, UA: NEGATIVE
Nitrite, UA: NEGATIVE
Protein, UA: POSITIVE — AB
Spec Grav, UA: 1.015 (ref 1.010–1.025)
Urobilinogen, UA: 0.2 E.U./dL
pH, UA: 6 (ref 5.0–8.0)

## 2021-05-08 MED ORDER — TAMSULOSIN HCL 0.4 MG PO CAPS: 0.4000 mg | ORAL_CAPSULE | Freq: Every day | ORAL | 1 refills | Status: DC

## 2021-05-08 NOTE — Assessment & Plan Note (Addendum)
Current sx most likely r/t BPH, pt denies any history of similar sx. Will start trial of Flomax, advised to f/u w/PCP.

## 2021-05-08 NOTE — Progress Notes (Signed)
Subjective:     Patient ID: Randall Williams, male    DOB: September 02, 1932, 87 y.o.   MRN: [id number]  Chief Complaint  Patient presents with   Urinary Frequency    Started 5 days ago. He denies back pain or dizziness.    Dysuria    HPI: Benign Prostatic Hypertrophy: Patient complains of lower urinary tract symptoms. Patient reports mild symptoms of BPH. Onset of symptoms was 1 week ago and was sudden in onset. His manifested as irritative symptoms including frequency, urgency, nocturia. He has no personal history of prostate cancer.  He reports frequency, incomplete emptying, nocturia four times a night, straining, and weak stream.  He reports a  history of no complicating symptoms.  He denies flank pain and gross hematuria.    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 90 tablet 3   azelastine (ASTELIN) 0.1 % nasal spray Place 2 sprays into both nostrils 2 (two) times daily. 30 mL 12   latanoprost (XALATAN) 0.005 % ophthalmic solution 1 drop at bedtime.     COVID-19 mRNA vaccine, Pfizer, 30 MCG/0.3ML injection Inject into the muscle. (Patient not taking: Reported on 05/08/2021) 0.3 mL 5   No facility-administered medications prior to visit.    Allergies  Allergen Reactions   Synthroid [Levothyroxine Sodium] Hives and Rash        Objective:    Physical Exam Vitals and nursing note reviewed.  Constitutional:      General: He is not in acute distress.    Appearance:  Normal appearance.  HENT:     Head: Normocephalic.  Cardiovascular:     Rate and Rhythm: Normal rate and regular rhythm.  Pulmonary:     Effort: Pulmonary effort is normal.     Breath sounds: Normal breath sounds.  Musculoskeletal:        General: Normal range of motion.     Cervical back: Normal range of motion.  Skin:    General: Skin is warm and dry.  Neurological:     Mental Status: He is alert and oriented to person, place, and time.  Psychiatric:        Mood and Affect: Mood normal.    BP 140/80   Pulse 79   Temp 97.9 F (36.6 C) (Temporal)   Ht 5\' 2"  (1.575 m)   Wt 141 lb 12.8 oz (64.3 kg)   SpO2 97%   BMI 25.94 kg/m  Wt Readings from Last 3 Encounters:  05/08/21 141 lb 12.8 oz (64.3 kg)  10/11/20 143 lb 4 oz (65 kg)  11/04/19 153 lb 9.6 oz (69.7 kg)       Assessment & Plan:   Problem List Items Addressed This Visit       Other   Frequency of urination - Primary    Current sx most likely r/t BPH, pt denies any history of similar sx. Will start trial of Flomax, advised to f/u w/PCP.  Relevant Medications   tamsulosin (FLOMAX) 0.4 MG CAPS capsule   Other Relevant Orders   POCT Urinalysis Dipstick (Completed)    Meds ordered this encounter  Medications   tamsulosin (FLOMAX) 0.4 MG CAPS capsule    Sig: Take 1 capsule (0.4 mg total) by mouth daily after supper.    Dispense:  30 capsule    Refill:  1    Order Specific Question:   Supervising Provider    Answer:   ANDY, CAMILLE L [[id number]]

## 2021-05-08 NOTE — Patient Instructions (Signed)
It was very nice to see you today!  I have sent a medication to your pharmacy to help with your bladder symptoms. Start this tonight. Follow up with Dr. Jerline Pain in 1 month and let him know if the medication is helping.    PLEASE NOTE:  If you had any lab tests please let us know if you have not heard back within a few days. You may see your results on MyChart before we have a chance to review them but we will give you a call once they are reviewed by [place]. If we ordered any referrals today, please let us know if you have not heard from their office within the next week.   Please try these tips to maintain a healthy lifestyle:  Eat most of your calories during the day when you are active. Eliminate processed foods including packaged sweets (pies, cakes, cookies), reduce intake of potatoes, white bread, white pasta, and white rice. Look for whole grain options, oat flour or almond flour.  Each meal should contain half fruits/vegetables, one quarter protein, and one quarter carbs (no bigger than a computer mouse).  Cut down on sweet beverages. This includes juice, soda, and sweet tea. Also watch fruit intake, though this is a healthier sweet option, it still contains natural sugar! Limit to 3 servings daily.  Drink at least 1 glass of water with each meal and aim for at least 8 glasses per day  Exercise at least 150 minutes every week.

## 2021-05-15 ENCOUNTER — Telehealth: Payer: Self-pay

## 2021-05-15 NOTE — Telephone Encounter (Signed)
See note

## 2021-05-15 NOTE — Telephone Encounter (Signed)
Nurse Assessment Nurse: Maebelle Munroe, RN, Langley Gauss Date/Time (Eastern Time): 05/15/2021 3:29:38 PM Confirm and document reason for call. If symptomatic, describe symptoms. ---Pt. states he has lower back pain. Wanting U/A results from 2 weeks ago. Urinating ok. frequency at night. I have been drinking a lot of water.  Does the patient have any new or worsening symptoms? ---Yes Will a triage be completed? ---Yes Related visit to physician within the last 2 weeks? ---Yes Does the PT have any chronic conditions? (i.e. diabetes, asthma, this includes High risk factors for pregnancy, etc.) ---No Is this a behavioral health or substance abuse call? ---No  Back Pain  [1] SEVERE back pain (e.g., excruciating) [2] sudden onset  [3] age > 8 years Lipps, RN, Langley Gauss 05/15/2021 3:33:58 PM  Pain rated 7/10 on scale.  05/15/2021 3:40:00 PM Go to ED Now Yes Lipps, RN, Langley Gauss

## 2021-05-16 NOTE — Telephone Encounter (Signed)
HE saw stephanie for this a couple of weeks ago. He has blood and protein in his urine. Recommend he get evaluated ASAP if he is having pain.  Algis Greenhouse. Jerline Pain, MD 05/16/2021 7:54 AM

## 2021-05-17 NOTE — Telephone Encounter (Signed)
Left message to return call to our office at their convenience.  

## 2021-05-28 ENCOUNTER — Other Ambulatory Visit: Payer: Self-pay

## 2021-05-28 ENCOUNTER — Encounter: Payer: Self-pay | Admitting: Family Medicine

## 2021-05-28 ENCOUNTER — Ambulatory Visit (INDEPENDENT_AMBULATORY_CARE_PROVIDER_SITE_OTHER): Payer: Medicare Other | Admitting: Family Medicine

## 2021-05-28 VITALS — BP 137/70 | HR 88 | Temp 98.1°F | Ht 62.0 in | Wt 138.0 lb

## 2021-05-28 DIAGNOSIS — R350 Frequency of micturition: Secondary | ICD-10-CM

## 2021-05-28 DIAGNOSIS — R300 Dysuria: Secondary | ICD-10-CM

## 2021-05-28 DIAGNOSIS — R399 Unspecified symptoms and signs involving the genitourinary system: Secondary | ICD-10-CM

## 2021-05-28 DIAGNOSIS — E785 Hyperlipidemia, unspecified: Secondary | ICD-10-CM

## 2021-05-28 LAB — POCT URINALYSIS DIPSTICK
Bilirubin, UA: NEGATIVE
Blood, UA: NEGATIVE
Glucose, UA: NEGATIVE
Ketones, UA: NEGATIVE
Leukocytes, UA: NEGATIVE
Nitrite, UA: NEGATIVE
Protein, UA: POSITIVE — AB
Spec Grav, UA: 1.015 (ref 1.010–1.025)
Urobilinogen, UA: 0.2 E.U./dL
pH, UA: 6 (ref 5.0–8.0)

## 2021-05-28 MED ORDER — TAMSULOSIN HCL 0.4 MG PO CAPS: 0.4000 mg | ORAL_CAPSULE | Freq: Every day | ORAL | 3 refills | Status: DC

## 2021-05-28 NOTE — Patient Instructions (Signed)
It was very nice to see you today!  I am glad you are feeling better with the Flomax.  We can continue this prescription.  Please let me know if you would like to see the urologist.  Please let me know if your ankle pain does not continue to improve.  I like to see you back in about 6 months for your annual checkup with labs.  Please come back to see me sooner if needed.  Take care, Dr Jerline Pain  PLEASE NOTE:  If you had any lab tests please let us know if you have not heard back within a few days. You may see your results on mychart before we have a chance to review them but we will give you a call once they are reviewed by [place]. If we ordered any referrals today, please let us know if you have not heard from their office within the next week.   Please try these tips to maintain a healthy lifestyle:  Eat at least 3 REAL meals and 1-2 snacks per day.  Aim for no more than 5 hours between eating.  If you eat breakfast, please do so within one hour of getting up.   Each meal should contain half fruits/vegetables, one quarter protein, and one quarter carbs (no bigger than a computer mouse)  Cut down on sweet beverages. This includes juice, soda, and sweet tea.   Drink at least 1 glass of water with each meal and aim for at least 8 glasses per day  Exercise at least 150 minutes every week.

## 2021-05-28 NOTE — Assessment & Plan Note (Signed)
Is doing much better with Flomax.  We will continue this.  We discussed referral to urology however he would like to defer for now.  Discussed reasons to return to care.

## 2021-05-28 NOTE — Assessment & Plan Note (Signed)
Check lipids when he comes back in for CPE in about 6 months.

## 2021-05-28 NOTE — Progress Notes (Signed)
   Randall Williams is a 87 y.o. male who presents today for an office visit.  Assessment/Plan:  New/Acute Problems: Left ankle Pain Likely mild sprain.  Symptoms are improving.  We will continue with watchful waiting.  Discussed reasons to return to care.  Chronic Problems Addressed Today: Lower urinary tract symptoms (LUTS) Is doing much better with Flomax.  We will continue this.  We discussed referral to urology however he would like to defer for now.  Discussed reasons to return to care.  Dyslipidemia Check lipids when he comes back in for CPE in about 6 months.     Subjective:  HPI:  Patient here with frequent urination. This has been going on for several weeks. He saw a different provider in this office about 3 weeks ago and was started on flomax. This seems to have significantly improved his symptoms. Frequency has significantly improved. No fevers or chills. No dysuria   He has also had some left ankle pain for the last 4 weeks or so.  He thinks he may have sprained it.  He was walking and like he had a twisting motion of his left ankle.  He did have some pain to the outer aspect of his left ankle.  He has been using ice and compression.  He is also been using an over-the-counter cream.  All this seems to help.  Still has a minimal amount of pain.  He is able to bear weight without issue.       Objective:  Physical Exam: BP 137/70   Pulse 88   Temp 98.1 F (36.7 C) (Temporal)   Ht 5\' 2"  (1.575 m)   Wt 138 lb (62.6 kg)   SpO2 98%   BMI 25.24 kg/m   Gen: No acute distress, resting comfortably CV: Regular rate and rhythm with no murmurs appreciated Pulm: Normal work of breathing, clear to auscultation bilaterally with no crackles, wheezes, or rhonchi MSK: Left ankle without deformity.  Nontender to palpation.  Able to bear weight. Neuro: Grossly normal, moves all extremities Psych: Normal affect and thought content      Hasson Gaspard M. Jerline Pain, MD 05/28/2021 12:13 PM

## 2021-05-29 LAB — URINE CULTURE
MICRO NUMBER:: 12744345
Result:: NO GROWTH
SPECIMEN QUALITY:: ADEQUATE

## 2021-06-05 NOTE — Progress Notes (Signed)
Please inform patient of the following:  Urine culture is negative. He does not have a UTI and does not need antibiotics.  Algis Greenhouse. Jerline Pain, MD 06/05/2021 8:00 AM

## 2021-08-02 DIAGNOSIS — H401132 Primary open-angle glaucoma, bilateral, moderate stage: Secondary | ICD-10-CM | POA: Diagnosis not present

## 2021-08-09 ENCOUNTER — Ambulatory Visit: Payer: Medicare Other | Admitting: Emergency Medicine

## 2021-10-09 ENCOUNTER — Encounter: Payer: Self-pay | Admitting: Physician Assistant

## 2021-10-09 ENCOUNTER — Ambulatory Visit (INDEPENDENT_AMBULATORY_CARE_PROVIDER_SITE_OTHER): Payer: Medicare Other | Admitting: Physician Assistant

## 2021-10-09 VITALS — BP 138/70 | HR 76 | Temp 98.1°F | Ht 62.0 in | Wt 141.0 lb

## 2021-10-09 DIAGNOSIS — L600 Ingrowing nail: Secondary | ICD-10-CM

## 2021-10-09 DIAGNOSIS — H9193 Unspecified hearing loss, bilateral: Secondary | ICD-10-CM

## 2021-10-09 NOTE — Progress Notes (Signed)
Randall Williams is a 88 y.o. male here for a ingrown toenail. ? ?History of Present Illness:  ? ?Chief Complaint  ?Patient presents with  ?? Ingrown Toenail  ?  Pt c/o ingrown toe nail, left great toe, having some pain.  ? ?Randall Williams presents to today's visit with his wife, [name]. ? ?HPI ? ?Ingrown Toenail ?Randall Williams presents with c/o an ingrown toenail of his left great toe. States that it has become increasingly painful over the past week. His wife cut the corner of it on Friday and he had significant improvement of sx -- pain went from 8/10 to 2/10. He has not taken anything for pain. Denies: fevers, chills, purulent discharge, malaise. He would like to see podiatry. ? ?Hearing loss ?He recently went to [facility] for hearing screening and hearing aids. He is concerned because he has not had improvement of symptoms. He did not go to [facility]. ? ? ? ?Past Medical History:  ?Diagnosis Date  ?? Hypothyroidism 05/15/2016  ?? Lymphoma of lymph nodes of face [facility]) 2008  ?? Small bowel obstruction ([place]) 03/06/2014  ? ?  ?Social History  ? ?Tobacco Use  ?? Smoking status: Former  ?  Types: Cigarettes  ?  Quit date: 06/17/1968  ?  Years since quitting: 53.3  ?? Smokeless tobacco: Never  ?Substance Use Topics  ?? Alcohol use: No  ?? Drug use: No  ? ? ?Past Surgical History:  ?Procedure Laterality Date  ?? LAPAROSCOPIC CHOLECYSTECTOMY  2005  ? ? ?History reviewed. No pertinent family history. ? ?Allergies  ?Allergen Reactions  ?? Synthroid [Levothyroxine Sodium] Hives and Rash  ? ? ?Current Medications:  ? ?Current Outpatient Medications:  ??  Multiple Vitamins-Minerals (CENTRUM SILVER 50+MEN) TABS, Take 1 tablet by mouth daily in the afternoon., Disp: , Rfl:  ??  tamsulosin (FLOMAX) 0.4 MG CAPS capsule, Take 1 capsule (0.4 mg total) by mouth daily after supper., Disp: 90 capsule, Rfl: 3 ??  vitamin C (ASCORBIC ACID) 500 MG tablet, Take 500 mg by mouth daily., Disp: , Rfl:   ? ?Review of Systems:  ? ?ROS ?Negative unless otherwise  specified per HPI. ?Vitals:  ? ?Vitals:  ? 10/09/21 1411  ?BP: 138/70  ?Pulse: 76  ?Temp: 98.1 ?F (36.7 ?C)  ?TempSrc: Temporal  ?SpO2: 96%  ?Weight: 141 lb (64 kg)  ?Height: '5\' 2"'$  (1.575 m)  ?   ?Body mass index is 25.79 kg/m?. ? ?Physical Exam:  ? ?Physical Exam ?Vitals and nursing note reviewed.  ?Constitutional:   ?   Appearance: He is well-developed.  ?HENT:  ?   Head: Normocephalic.  ?Eyes:  ?   Conjunctiva/sclera: Conjunctivae normal.  ?   Pupils: Pupils are equal, round, and reactive to light.  ?Pulmonary:  ?   Effort: Pulmonary effort is normal.  ?Musculoskeletal:     ?   General: Normal range of motion.  ?   Cervical back: Normal range of motion.  ?Feet:  ?   Comments: Slight TTP to distal medial left great toenail; no purulent discharge; toenail is dystrophic compared to others ?Skin: ?   General: Skin is warm and dry.  ?Neurological:  ?   Mental Status: He is alert and oriented to person, place, and time.  ?Psychiatric:     ?   Behavior: Behavior normal.     ?   Thought Content: Thought content normal.     ?   Judgment: Judgment normal.  ? ? ?Assessment and Plan:  ? ?Ingrown left big toenail ?  Patient requesting referral to podiatry ?No obvious infection requiring oral abx at this time ?I have asked him to let us know if any new sx or worsening in the meantime ?Tylenol for pain ? ?Decreased hearing of both ears ?Referral for [facility] for full audiologic exam ?Contact info provided ? ? ?I,Havlyn C Ratchford,acting as a scribe for [employer], PA.,have documented all relevant documentation on the behalf of Inda Coke, PA,as directed by  Inda Coke, PA while in the presence of Inda Coke, [place]. ? ?IInda Coke, PA, have reviewed all documentation for this visit. The documentation on 10/09/21 for the exam, diagnosis, procedures, and orders are all accurate and complete. ? ? ?Inda Coke, PA-C ? ?

## 2021-10-09 NOTE — Patient Instructions (Signed)
It was great to see you! ? ?If you get fever, chills, increased pain, if discharge comes from the area, call me ? ?Tylenol for pain ? ?You will be getting a call from the foot doctor ? ?Please call [facility] to follow-up on your hearing loss: [phone] ? ?Take care, ? ?Inda Coke PA-C  ?

## 2021-10-19 ENCOUNTER — Ambulatory Visit (INDEPENDENT_AMBULATORY_CARE_PROVIDER_SITE_OTHER): Payer: Medicare Other | Admitting: Podiatry

## 2021-10-19 DIAGNOSIS — L600 Ingrowing nail: Secondary | ICD-10-CM | POA: Diagnosis not present

## 2021-10-19 NOTE — Progress Notes (Signed)
?  Subjective:  ?Patient ID: Randall Williams, male    DOB: Feb 10, 1933,  MRN: [id number] ? ?Chief Complaint  ?Patient presents with  ? Ingrown Toenail  ? ? ?88 y.o. male presents with the above complaint.  Patient presents with complaint bilateral medial border ingrown.  Patient states painful to touch is progressive gotten worse.  He states hurts with ambulation.  He would like to have it removed.  Is not infected. ? ? ?Review of Systems: Negative except as noted in the HPI. Denies N/V/F/Ch. ? ?Past Medical History:  ?Diagnosis Date  ? Hypothyroidism 05/15/2016  ? Lymphoma of lymph nodes of face [facility]) 2008  ? Small bowel obstruction ([place]) 03/06/2014  ? ? ?Current Outpatient Medications:  ?  Multiple Vitamins-Minerals (CENTRUM SILVER 50+MEN) TABS, Take 1 tablet by mouth daily in the afternoon., Disp: , Rfl:  ?  tamsulosin (FLOMAX) 0.4 MG CAPS capsule, Take 1 capsule (0.4 mg total) by mouth daily after supper., Disp: 90 capsule, Rfl: 3 ?  vitamin C (ASCORBIC ACID) 500 MG tablet, Take 500 mg by mouth daily., Disp: , Rfl:  ? ?Social History  ? ?Tobacco Use  ?Smoking Status Former  ? Types: Cigarettes  ? Quit date: 06/17/1968  ? Years since quitting: 53.3  ?Smokeless Tobacco Never  ? ? ?Allergies  ?Allergen Reactions  ? Synthroid [Levothyroxine Sodium] Hives and Rash  ? ?Objective:  ?There were no vitals filed for this visit. ?There is no height or weight on file to calculate BMI. ?Constitutional Well developed. ?Well nourished.  ?Vascular Dorsalis pedis pulses palpable bilaterally. ?Posterior tibial pulses palpable bilaterally. ?Capillary refill normal to all digits.  ?No cyanosis or clubbing noted. ?Pedal hair growth normal.  ?Neurologic Normal speech. ?Oriented to person, place, and time. ?Epicritic sensation to light touch grossly present bilaterally.  ?Dermatologic Painful ingrowing nail at medial nail borders of the hallux nail bilaterally. ?No other open wounds. ?No skin lesions.  ?Orthopedic: Normal joint ROM without  pain or crepitus bilaterally. ?No visible deformities. ?No bony tenderness.  ? ?Radiographs: None ?Assessment:  ? ?1. Ingrown toenail of right foot   ?2. Ingrown left big toenail   ? ?Plan:  ?Patient was evaluated and treated and all questions answered. ? ?Ingrown Nail, bilaterally ?-Patient elects to proceed with minor surgery to remove ingrown toenail removal today. Consent reviewed and signed by patient. ?-Ingrown nail excised. See procedure note. ?-Educated on post-procedure care including soaking. Written instructions provided and reviewed. ?-Patient to follow up in 2 weeks for nail check. ? ?Procedure: Excision of Ingrown Toenail ?Location: Bilateral 1st toe medial nail borders. ?Anesthesia: Lidocaine 1% plain; 1.5 mL and Marcaine 0.5% plain; 1.5 mL, digital block. ?Skin Prep: Betadine. ?Dressing: Silvadene; telfa; dry, sterile, compression dressing. ?Technique: Following skin prep, the toe was exsanguinated and a tourniquet was secured at the base of the toe. The affected nail border was freed, split with a nail splitter, and excised. Chemical matrixectomy was then performed with phenol and irrigated out with alcohol. The tourniquet was then removed and sterile dressing applied. ?Disposition: Patient tolerated procedure well. Patient to return in 2 weeks for follow-up.  ? ?No follow-ups on file. ?

## 2021-10-19 NOTE — Patient Instructions (Addendum)
Coloque 1/4 taza de sales de [place] en un cuarto de gal?n de agua tibia del grifo. Sumerja su pie o pies en la soluci?n y d?jelos en remojo durante 20 minutos. Este remojo debe [employer] veces al d?a. Luego, retire su pie o pies de la soluci?n, seque el ?rea afectada. Aplique ung?ento y [place] si se lo indica su m?dico. ? ?SI SU PIEL SE IRRITA MIENTRAS UTILIZA ESTAS INSTRUCCIONES, Randall Williams. ?Como otra alternativa de remojo, puede usar agua y jab?n antibacteriano. ? ?Controle cualquier signo/s?ntoma de infecci?n. Llame a la oficina inmediatamente si ocurre algo o vaya directamente a la sala de emergencias. Llame con cualquier pregunta/inquietud. ?

## 2021-11-20 ENCOUNTER — Ambulatory Visit (INDEPENDENT_AMBULATORY_CARE_PROVIDER_SITE_OTHER): Payer: Medicare Other | Admitting: Family Medicine

## 2021-11-20 ENCOUNTER — Encounter: Payer: Self-pay | Admitting: Family Medicine

## 2021-11-20 VITALS — BP 120/69 | HR 69 | Temp 98.0°F | Ht 62.0 in | Wt 141.4 lb

## 2021-11-20 DIAGNOSIS — N183 Chronic kidney disease, stage 3 unspecified: Secondary | ICD-10-CM

## 2021-11-20 DIAGNOSIS — R399 Unspecified symptoms and signs involving the genitourinary system: Secondary | ICD-10-CM | POA: Diagnosis not present

## 2021-11-20 DIAGNOSIS — R739 Hyperglycemia, unspecified: Secondary | ICD-10-CM | POA: Diagnosis not present

## 2021-11-20 DIAGNOSIS — E785 Hyperlipidemia, unspecified: Secondary | ICD-10-CM | POA: Diagnosis not present

## 2021-11-20 NOTE — Progress Notes (Signed)
   Kevork Joyce is a 88 y.o. male who presents today for an office visit.  Assessment/Plan:  Chronic Problems Addressed Today: Lower urinary tract symptoms (LUTS) Doing much better on Flomax 0.4 mg daily.  No significant side effects.  He will continue for now. Does not need refill today.   Dyslipidemia Check labs.  CKD (chronic kidney disease) stage 3, GFR 30-59 ml/min (HCC) Check labs.     Subjective:  HPI:  See A/p for status of chronic conditions.         Objective:  Physical Exam: BP 120/69   Pulse 69   Temp 98 F (36.7 C) (Temporal)   Ht '5\' 2"'$  (1.575 m)   Wt 141 lb 6.4 oz (64.1 kg)   SpO2 97%   BMI 25.86 kg/m   Gen: No acute distress, resting comfortably CV: Regular rate and rhythm with no murmurs appreciated Pulm: Normal work of breathing, clear to auscultation bilaterally with no crackles, wheezes, or rhonchi Neuro: Grossly normal, moves all extremities Psych: Normal affect and thought content      Shemuel Harkleroad M. Jerline Pain, MD 11/20/2021 10:19 AM

## 2021-11-20 NOTE — Assessment & Plan Note (Signed)
Doing much better on Flomax 0.4 mg daily.  No significant side effects.  He will continue for now. Does not need refill today.

## 2021-11-20 NOTE — Assessment & Plan Note (Signed)
Check labs 

## 2021-11-20 NOTE — Patient Instructions (Addendum)
It was very nice to see you today!  I am glad that you are doing well.  Please come back soon to have your blood work checked.  We will see you back in 6 to 12 months.  Please come back to see [place] sooner if needed.  Take care, Dr Jerline Pain  PLEASE NOTE:  If you had any lab tests please let us know if you have not heard back within a few days. You may see your results on mychart before we have a chance to review them but we will give you a call once they are reviewed by [place]. If we ordered any referrals today, please let us know if you have not heard from their office within the next week.   Please try these tips to maintain a healthy lifestyle:  Eat at least 3 REAL meals and 1-2 snacks per day.  Aim for no more than 5 hours between eating.  If you eat breakfast, please do so within one hour of getting up.   Each meal should contain half fruits/vegetables, one quarter protein, and one quarter carbs (no bigger than a computer mouse)  Cut down on sweet beverages. This includes juice, soda, and sweet tea.   Drink at least 1 glass of water with each meal and aim for at least 8 glasses per day  Exercise at least 150 minutes every week.

## 2021-11-22 DIAGNOSIS — H40013 Open angle with borderline findings, low risk, bilateral: Secondary | ICD-10-CM | POA: Diagnosis not present

## 2021-11-22 DIAGNOSIS — H353130 Nonexudative age-related macular degeneration, bilateral, stage unspecified: Secondary | ICD-10-CM | POA: Diagnosis not present

## 2021-11-23 ENCOUNTER — Other Ambulatory Visit (INDEPENDENT_AMBULATORY_CARE_PROVIDER_SITE_OTHER): Payer: Medicare Other

## 2021-11-23 DIAGNOSIS — R739 Hyperglycemia, unspecified: Secondary | ICD-10-CM

## 2021-11-23 DIAGNOSIS — E785 Hyperlipidemia, unspecified: Secondary | ICD-10-CM | POA: Diagnosis not present

## 2021-11-23 LAB — LIPID PANEL
Cholesterol: 256 mg/dL — ABNORMAL HIGH (ref 0–200)
HDL: 56.6 mg/dL (ref >39.00)
LDL Cholesterol: 169 mg/dL — ABNORMAL HIGH (ref 0–99)
NonHDL: 198.99
Total CHOL/HDL Ratio: 5
Triglycerides: 151 mg/dL — ABNORMAL HIGH (ref 0.0–149.0)
VLDL: 30.2 mg/dL (ref 0.0–40.0)

## 2021-11-23 LAB — CBC
HCT: 28.1 % — ABNORMAL LOW (ref 39.0–52.0)
Hemoglobin: 9.6 g/dL — ABNORMAL LOW (ref 13.0–17.0)
MCHC: 34.2 g/dL (ref 30.0–36.0)
MCV: 89.3 fl (ref 78.0–100.0)
Platelets: 244 10*3/uL (ref 150.0–400.0)
RBC: 3.14 Mil/uL — ABNORMAL LOW (ref 4.22–5.81)
RDW: 14.8 % (ref 11.5–15.5)
WBC: 7 10*3/uL (ref 4.0–10.5)

## 2021-11-23 LAB — COMPREHENSIVE METABOLIC PANEL
ALT: 12 U/L (ref 0–53)
AST: 16 U/L (ref 0–37)
Albumin: 2.9 g/dL — ABNORMAL LOW (ref 3.5–5.2)
Alkaline Phosphatase: 66 U/L (ref 39–117)
BUN: 31 mg/dL — ABNORMAL HIGH (ref 6–23)
CO2: 25 mEq/L (ref 19–32)
Calcium: 8.7 mg/dL (ref 8.4–10.5)
Chloride: 107 mEq/L (ref 96–112)
Creatinine, Ser: 1.75 mg/dL — ABNORMAL HIGH (ref 0.40–1.50)
GFR: 34.68 mL/min — ABNORMAL LOW (ref >60.00)
Glucose, Bld: 94 mg/dL (ref 70–99)
Potassium: 4.5 mEq/L (ref 3.5–5.1)
Sodium: 136 mEq/L (ref 135–145)
Total Bilirubin: 0.4 mg/dL (ref 0.2–1.2)
Total Protein: 6 g/dL (ref 6.0–8.3)

## 2021-11-23 LAB — HEMOGLOBIN A1C: Hgb A1c MFr Bld: 5.9 % (ref 4.6–6.5)

## 2021-11-23 LAB — TSH: TSH: 12.81 u[IU]/mL — ABNORMAL HIGH (ref 0.35–5.50)

## 2021-11-26 ENCOUNTER — Ambulatory Visit: Payer: Medicare Other | Admitting: Family Medicine

## 2021-11-26 NOTE — Progress Notes (Signed)
Please inform patient of the following:  His thyroid levels are off. He needs to be on a thyroid medication. Recommend starting levothyroxine 76mg daily and we should recheck in 4-6 weeks.  His blood counts also dropped a bit. This could be due to the thyroid issues. CAn we also place a future order for CBC for him to come in a recheck in 4-6 weeks?  The rest of his labs are all stable and we can recheck in a year.

## 2021-11-29 ENCOUNTER — Other Ambulatory Visit: Payer: Self-pay | Admitting: *Deleted

## 2021-11-29 ENCOUNTER — Other Ambulatory Visit (HOSPITAL_COMMUNITY): Payer: Self-pay

## 2021-11-29 DIAGNOSIS — R799 Abnormal finding of blood chemistry, unspecified: Secondary | ICD-10-CM

## 2021-11-30 ENCOUNTER — Telehealth: Payer: Self-pay | Admitting: Family Medicine

## 2021-11-30 NOTE — Telephone Encounter (Signed)
I have spoken to patient in regard to scheduling appt with Jerline Pain for follow up on labs.    Patient states he is going out of [place] for 2 or 3 weeks.  States once he is back, he will reach out to schedule with Jerline Pain.

## 2021-12-04 ENCOUNTER — Other Ambulatory Visit: Payer: Self-pay | Admitting: *Deleted

## 2021-12-04 DIAGNOSIS — Z8639 Personal history of other endocrine, nutritional and metabolic disease: Secondary | ICD-10-CM

## 2022-02-27 ENCOUNTER — Other Ambulatory Visit: Payer: Self-pay | Admitting: Family Medicine

## 2022-03-07 DIAGNOSIS — Z23 Encounter for immunization: Secondary | ICD-10-CM | POA: Diagnosis not present

## 2022-03-11 ENCOUNTER — Encounter: Payer: Self-pay | Admitting: *Deleted

## 2022-03-25 ENCOUNTER — Telehealth: Payer: Self-pay | Admitting: Family Medicine

## 2022-03-25 NOTE — Telephone Encounter (Signed)
Copied from [place] [phone]. Topic: Medicare AWV >> Mar 25, 2022  9:51 AM Devoria Glassing wrote: Reason for CRM: Left message for patient to schedule Annual Wellness Visit.  Please schedule with Nurse Health Advisor Charlott Rakes, RN at [facility]. This appt can be telephone or office visit. Please call [phone] ask for [facility]

## 2022-05-17 ENCOUNTER — Other Ambulatory Visit: Payer: Self-pay | Admitting: Family Medicine

## 2022-05-17 DIAGNOSIS — R350 Frequency of micturition: Secondary | ICD-10-CM

## 2022-05-30 ENCOUNTER — Encounter: Payer: Self-pay | Admitting: *Deleted

## 2022-06-05 ENCOUNTER — Telehealth: Payer: Self-pay | Admitting: Family Medicine

## 2022-06-05 NOTE — Telephone Encounter (Signed)
Copied from [place] [id number]. Topic: Medicare AWV >> Jun 05, 2022  1:02 PM Gillis Santa wrote: Reason for CRM: LVM PATIENT TO CALL [phone] TO SCHEDULE AWV [place]

## 2022-07-01 ENCOUNTER — Ambulatory Visit: Payer: Medicare Other | Admitting: Family Medicine

## 2022-07-01 ENCOUNTER — Ambulatory Visit (INDEPENDENT_AMBULATORY_CARE_PROVIDER_SITE_OTHER): Payer: Medicare Other | Admitting: Family

## 2022-07-01 ENCOUNTER — Encounter: Payer: Self-pay | Admitting: Family

## 2022-07-01 VITALS — BP 170/81 | HR 66 | Temp 97.8°F | Ht 62.0 in | Wt 146.2 lb

## 2022-07-01 DIAGNOSIS — L299 Pruritus, unspecified: Secondary | ICD-10-CM

## 2022-07-01 MED ORDER — TRIAMCINOLONE ACETONIDE 0.1 % EX CREA: 1.0000 | TOPICAL_CREAM | Freq: Two times a day (BID) | CUTANEOUS | 1 refills | Status: DC

## 2022-07-01 NOTE — Patient Instructions (Signed)
It was very nice to see you today!   I believe your itching is mostly related to dry skin due to the very dry winter air and heat running all day.  Use the medicated cream I sent to your pharmacy on front of your lower legs twice a day. Then cover with a thick cream (Gold Bond is fine, or can use generic Cetaphil, Eucerin, CeraVe, etc) - use this cream all over your body to keep moisturized.   Let us know if you are still itching after a week or so.       PLEASE NOTE:  If you had any lab tests please let us know if you have not heard back within a few days. You may see your results on MyChart before we have a chance to review them but we will give you a call once they are reviewed by [place]. If we ordered any referrals today, please let us know if you have not heard from their office within the next week.

## 2022-07-01 NOTE — Progress Notes (Signed)
Patient ID: Randall Williams, male    DOB: 19-May-1933, 89 y.o.   MRN: [id number]  Chief Complaint  Patient presents with   Leg Problem    Pt c/o itchy bilateral legs for a week and a half. No rash, redness present.     HPI:      Itching:  complains of itching on bilateral anterior lower legs, states it feels warm at times, but denies any redness. Started almost 2 weeks ago, denies any visible rash, or any itching anywhere else. Has soaked his legs in some water and vinegar which helps for a while then itching returns. Also has used Gold Bond anti-itch cream which also helps for short time.      Assessment & Plan:  1. Itching - bilateral lower legs - no visible rash, believe mostly r/t dry skin. Advised to stop using the vinegar water; Use a moisturizing soap with luke warm water (not hot), dry skin well. Sending steroid cream to try, apply thin layer to the fronts of both lower legs and then cover with the Gold Bond (or equivalent thick, OTC cream), use the cream all over the body to avoid dry skin/itching elsewhere. Call back if not helping after a week.  - triamcinolone cream (KENALOG) 0.1 %; Apply 1 Application topically 2 (two) times daily. Apply to the lower front part of your legs, cover with the Gold Bond cream you have at home.  Dispense: 30 g; Refill: 1  Subjective:    Outpatient Medications Prior to Visit  Medication Sig Dispense Refill   Multiple Vitamins-Minerals (CENTRUM SILVER 50+MEN) TABS Take 1 tablet by mouth daily in the afternoon.     tamsulosin (FLOMAX) 0.4 MG CAPS capsule TAKE ONE CAPSULE BY MOUTH EVERY EVENING AFTER SUPPER 90 capsule 3   vitamin C (ASCORBIC ACID) 500 MG tablet Take 500 mg by mouth daily.     No facility-administered medications prior to visit.   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  Constitutional:      General: He is not in acute distress.    Appearance: Normal appearance.  HENT:     Head: Normocephalic.  Cardiovascular:     Rate and Rhythm: Normal rate and regular rhythm.  Pulmonary:     Effort: Pulmonary effort is normal.     Breath sounds: Normal breath sounds.  Musculoskeletal:        General: Normal range of motion.     Cervical back: Normal range of motion.  Skin:    General: Skin is warm and dry.     Findings: Lesion (a few tiny red sratches in skin on bilateral anterior lower legs  where pt has scratched his skin d/t itching) present. No rash.  Neurological:     Mental Status: He is alert and oriented to person, place, and time.  Psychiatric:        Mood and Affect: Mood normal.    BP (!) 170/81 (BP Location: Left Arm, Patient Position: Sitting, Cuff Size: Large)   Pulse 66   Temp 97.8 F (36.6 C) (Temporal)   Ht '5\' 2"'$  (1.575 m)  Wt 146 lb 4 oz (66.3 kg)   SpO2 97%   BMI 26.75 kg/m  Wt Readings from Last 3 Encounters:  07/01/22 146 lb 4 oz (66.3 kg)  11/20/21 141 lb 6.4 oz (64.1 kg)  10/09/21 141 lb (64 kg)       Jeanie Sewer, NP

## 2022-07-12 ENCOUNTER — Encounter: Payer: Self-pay | Admitting: Family Medicine

## 2022-07-12 ENCOUNTER — Ambulatory Visit (INDEPENDENT_AMBULATORY_CARE_PROVIDER_SITE_OTHER): Payer: Medicare Other | Admitting: Family Medicine

## 2022-07-12 VITALS — BP 108/65 | HR 71 | Temp 97.7°F | Ht 62.0 in | Wt 144.0 lb

## 2022-07-12 DIAGNOSIS — N183 Chronic kidney disease, stage 3 unspecified: Secondary | ICD-10-CM

## 2022-07-12 DIAGNOSIS — H269 Unspecified cataract: Secondary | ICD-10-CM

## 2022-07-12 DIAGNOSIS — R399 Unspecified symptoms and signs involving the genitourinary system: Secondary | ICD-10-CM | POA: Diagnosis not present

## 2022-07-12 DIAGNOSIS — L719 Rosacea, unspecified: Secondary | ICD-10-CM | POA: Diagnosis not present

## 2022-07-12 DIAGNOSIS — Z8639 Personal history of other endocrine, nutritional and metabolic disease: Secondary | ICD-10-CM

## 2022-07-12 DIAGNOSIS — R739 Hyperglycemia, unspecified: Secondary | ICD-10-CM | POA: Diagnosis not present

## 2022-07-12 DIAGNOSIS — E785 Hyperlipidemia, unspecified: Secondary | ICD-10-CM

## 2022-07-12 LAB — CBC
HCT: 30 % — ABNORMAL LOW (ref 39.0–52.0)
Hemoglobin: 10.2 g/dL — ABNORMAL LOW (ref 13.0–17.0)
MCHC: 33.9 g/dL (ref 30.0–36.0)
MCV: 89.8 fl (ref 78.0–100.0)
Platelets: 285 10*3/uL (ref 150.0–400.0)
RBC: 3.34 Mil/uL — ABNORMAL LOW (ref 4.22–5.81)
RDW: 14.9 % (ref 11.5–15.5)
WBC: 7.3 10*3/uL (ref 4.0–10.5)

## 2022-07-12 LAB — COMPREHENSIVE METABOLIC PANEL
ALT: 11 U/L (ref 0–53)
AST: 17 U/L (ref 0–37)
Albumin: 3 g/dL — ABNORMAL LOW (ref 3.5–5.2)
Alkaline Phosphatase: 76 U/L (ref 39–117)
BUN: 36 mg/dL — ABNORMAL HIGH (ref 6–23)
CO2: 22 mEq/L (ref 19–32)
Calcium: 8.7 mg/dL (ref 8.4–10.5)
Chloride: 112 mEq/L (ref 96–112)
Creatinine, Ser: 2.42 mg/dL — ABNORMAL HIGH (ref 0.40–1.50)
GFR: 23.4 mL/min — ABNORMAL LOW (ref >60.00)
Glucose, Bld: 96 mg/dL (ref 70–99)
Potassium: 5.4 mEq/L — ABNORMAL HIGH (ref 3.5–5.1)
Sodium: 141 mEq/L (ref 135–145)
Total Bilirubin: 0.4 mg/dL (ref 0.2–1.2)
Total Protein: 5.9 g/dL — ABNORMAL LOW (ref 6.0–8.3)

## 2022-07-12 LAB — LIPID PANEL
Cholesterol: 346 mg/dL — ABNORMAL HIGH (ref 0–200)
HDL: 48.8 mg/dL (ref >39.00)
NonHDL: 296.98
Total CHOL/HDL Ratio: 7
Triglycerides: 246 mg/dL — ABNORMAL HIGH (ref 0.0–149.0)
VLDL: 49.2 mg/dL — ABNORMAL HIGH (ref 0.0–40.0)

## 2022-07-12 LAB — TSH: TSH: 8.6 u[IU]/mL — ABNORMAL HIGH (ref 0.35–5.50)

## 2022-07-12 LAB — LDL CHOLESTEROL, DIRECT: Direct LDL: 213 mg/dL

## 2022-07-12 LAB — HEMOGLOBIN A1C: Hgb A1c MFr Bld: 6.1 % (ref 4.6–6.5)

## 2022-07-12 LAB — T4, FREE: Free T4: 0.74 ng/dL (ref 0.60–1.60)

## 2022-07-12 MED ORDER — ERYTHROMYCIN 2 % EX PADS
MEDICATED_PAD | CUTANEOUS | 11 refills | Status: DC
Start: 2022-07-12 — End: 2023-11-24

## 2022-07-12 NOTE — Assessment & Plan Note (Signed)
Will place referral to ophthalmology for further evaluation and management.

## 2022-07-12 NOTE — Progress Notes (Signed)
   Randall Williams is a 89 y.o. male who presents today for an office visit.  Assessment/Plan:  New/Acute Problems: Cerumen Impaction  Successfully irrigated by RMA.  Can use over-the-counter Debrox or hydrogen peroxide to prevent buildup  Xerosis cutis No red flags.  Recommended topical daily emollient.  He can use triamcinolone as needed for irritation and inflammation.  Chronic Problems Addressed Today: History of hypothyroidism Check TSH.  Not currently on any medications.  CKD (chronic kidney disease) stage 3, GFR 30-59 ml/min (HCC) Check labs.  Avoid nephrotoxic meds.  Rosacea Stable on erythromycin topically.  Will refill today.  Dyslipidemia Check lipids.  Continue lifestyle modifications.  Works on stationary bike about 30 minutes daily.  Lower urinary tract symptoms (LUTS) Doing much better on Flomax 0.4 mg daily.  No significant side effects.  Cataract Will place referral to ophthalmology for further evaluation and management.     Subjective:  HPI:  See A/P for status of chronic conditions.  Patient is here today for routine follow-up.  He was recently diagnosed with cataract by his optometrist and was told that he needed to follow-up with an ophthalmologist for excision.  He would like to be referred to an ophthalmologist today.  Recently saw different provider here about a week ago for itching and lower legs.  Started on triamcinolone.  Itching has improved significantly.  He has also noticed decreased hearing in his right ear and is worried about earwax blockage.       Objective:  Physical Exam: BP 108/65   Pulse 71   Temp 97.7 F (36.5 C) (Temporal)   Ht '5\' 2"'$  (1.575 m)   Wt 144 lb (65.3 kg)   SpO2 97%   BMI 26.34 kg/m   Gen: No acute distress, resting comfortably HEENT: Right EAC with cerumen.  Left EAC clear. CV: Regular rate and rhythm with no murmurs appreciated Pulm: Normal work of breathing, clear to auscultation bilaterally with no  crackles, wheezes, or rhonchi Skin: Xerosis cutis bilateral lower extremities. Neuro: Grossly normal, moves all extremities Psych: Normal affect and thought content      Zuleyma Scharf M. Jerline Pain, MD 07/12/2022 10:38 AM

## 2022-07-12 NOTE — Assessment & Plan Note (Addendum)
Check TSH.  Not currently on any medications.

## 2022-07-12 NOTE — Assessment & Plan Note (Signed)
Check lipids.  Continue lifestyle modifications.  Works on stationary bike about 30 minutes daily.

## 2022-07-12 NOTE — Assessment & Plan Note (Signed)
Check labs.  Avoid nephrotoxic meds.

## 2022-07-12 NOTE — Assessment & Plan Note (Signed)
Stable on erythromycin topically.  Will refill today.

## 2022-07-12 NOTE — Assessment & Plan Note (Signed)
Doing much better on Flomax 0.4 mg daily.  No significant side effects.

## 2022-07-12 NOTE — Patient Instructions (Signed)
It was very nice to see you today!  We flushed out your ears today.  You can use hydrogen peroxide or Debrox to prevent this from coming back.  Please try using a lotion such as Aveeno or Eucerin for your legs.  We will check blood work today.  We will see you back in 6 months.  Come back sooner if needed.  Take care, Dr Jerline Pain  PLEASE NOTE:  If you had any lab tests, please let us know if you have not heard back within a few days. You may see your results on mychart before we have a chance to review them but we will give you a call once they are reviewed by [place].   If we ordered any referrals today, please let us know if you have not heard from their office within the next week.   If you had any urgent prescriptions sent in today, please check with the pharmacy within an hour of our visit to make sure the prescription was transmitted appropriately.   Please try these tips to maintain a healthy lifestyle:  Eat at least 3 REAL meals and 1-2 snacks per day.  Aim for no more than 5 hours between eating.  If you eat breakfast, please do so within one hour of getting up.   Each meal should contain half fruits/vegetables, one quarter protein, and one quarter carbs (no bigger than a computer mouse)  Cut down on sweet beverages. This includes juice, soda, and sweet tea.   Drink at least 1 glass of water with each meal and aim for at least 8 glasses per day  Exercise at least 150 minutes every week.    Preventive Care 65 Years and Older, Male Preventive care refers to lifestyle choices and visits with your health care provider that can promote health and wellness. Preventive care visits are also called wellness exams. What can I expect for my preventive care visit? Counseling During your preventive care visit, your health care provider may ask about your: Medical history, including: Past medical problems. Family medical history. History of falls. Current health, including: Emotional  well-being. Home life and relationship well-being. Sexual activity. Memory and ability to understand (cognition). Lifestyle, including: Alcohol, nicotine or tobacco, and drug use. Access to firearms. Diet, exercise, and sleep habits. Work and work Statistician. Sunscreen use. Safety issues such as seatbelt and bike helmet use. Physical exam Your health care provider will check your: Height and weight. These may be used to calculate your BMI (body mass index). BMI is a measurement that tells if you are at a healthy weight. Waist circumference. This measures the distance around your waistline. This measurement also tells if you are at a healthy weight and may help predict your risk of certain diseases, such as type 2 diabetes and high blood pressure. Heart rate and blood pressure. Body temperature. Skin for abnormal spots. What immunizations do I need?  Vaccines are usually given at various ages, according to a schedule. Your health care provider will recommend vaccines for you based on your age, medical history, and lifestyle or other factors, such as travel or where you work. What tests do I need? Screening Your health care provider may recommend screening tests for certain conditions. This may include: Lipid and cholesterol levels. Diabetes screening. This is done by checking your blood sugar (glucose) after you have not eaten for a while (fasting). Hepatitis C test. Hepatitis B test. HIV (human immunodeficiency virus) test. STI (sexually transmitted infection) testing, if you  are at risk. Lung cancer screening. Colorectal cancer screening. Prostate cancer screening. Abdominal aortic aneurysm (AAA) screening. You may need this if you are a current or former smoker. Talk with your health care provider about your test results, treatment options, and if necessary, the need for more tests. Follow these instructions at home: Eating and drinking  Eat a diet that includes fresh fruits  and vegetables, whole grains, lean protein, and low-fat dairy products. Limit your intake of foods with high amounts of sugar, saturated fats, and salt. Take vitamin and mineral supplements as recommended by your health care provider. Do not drink alcohol if your health care provider tells you not to drink. If you drink alcohol: Limit how much you have to 0-2 drinks a day. Know how much alcohol is in your drink. In the [place] one drink equals one 12 oz bottle of beer (355 mL), one 5 oz glass of wine (148 mL), or one 1 oz glass of hard liquor (44 mL). Lifestyle Brush your teeth every morning and night with fluoride toothpaste. Floss one time each day. Exercise for at least 30 minutes 5 or more days each week. Do not use any products that contain nicotine or tobacco. These products include cigarettes, chewing tobacco, and vaping devices, such as e-cigarettes. If you need help quitting, ask your health care provider. Do not use drugs. If you are sexually active, practice safe sex. Use a condom or other form of protection to prevent STIs. Take aspirin only as told by your health care provider. Make sure that you understand how much to take and what form to take. Work with your health care provider to find out whether it is safe and beneficial for you to take aspirin daily. Ask your health care provider if you need to take a cholesterol-lowering medicine (statin). Find healthy ways to manage stress, such as: Meditation, yoga, or listening to music. Journaling. Talking to a trusted person. Spending time with friends and family. Safety Always wear your seat belt while driving or riding in a vehicle. Do not drive: If you have been drinking alcohol. Do not ride with someone who has been drinking. When you are tired or distracted. While texting. If you have been using any mind-altering substances or drugs. Wear a helmet and other protective equipment during sports activities. If you have firearms in  your house, make sure you follow all gun safety procedures. Minimize exposure to UV radiation to reduce your risk of skin cancer. What's next? Visit your health care provider once a year for an annual wellness visit. Ask your health care provider how often you should have your eyes and teeth checked. Stay up to date on all vaccines. This information is not intended to replace advice given to you by your health care provider. Make sure you discuss any questions you have with your health care provider. Document Revised: 11/29/2020 Document Reviewed: 11/29/2020 Elsevier Patient Education  [place].

## 2022-07-16 NOTE — Progress Notes (Signed)
Please inform patient of the following:  His thyroid level is off. Recommend we start medication for his thyroid. He hsa been on this before. Please send in levothyroxine 25 mcg daily.  He should come back in 6 weeks to recheck his TSH.    His cholesterol levels very high.  We need to get him back on a cholesterol medication.  Please restart Lipitor 40 mg daily. We can recheck this in about 6 months.  His kidney function has decreased a bit since last time.  Please make sure that he is getting plenty of fluids and staying hydrated.  I would like to recheck this again in a few weeks.  Please place future order for be met.  His blood sugar is borderline but stable we can repeat this again in 6 months.  His blood counts are normal.  Aron Needles M. Jerline Pain, MD 07/16/2022 12:57 PM

## 2022-07-17 ENCOUNTER — Telehealth: Payer: Self-pay | Admitting: Family Medicine

## 2022-07-17 NOTE — Telephone Encounter (Signed)
Pt would like a call back with lab results 

## 2022-07-18 NOTE — Telephone Encounter (Signed)
See results note. 

## 2022-08-12 DIAGNOSIS — H25811 Combined forms of age-related cataract, right eye: Secondary | ICD-10-CM | POA: Diagnosis not present

## 2022-08-12 DIAGNOSIS — H353133 Nonexudative age-related macular degeneration, bilateral, advanced atrophic without subfoveal involvement: Secondary | ICD-10-CM | POA: Diagnosis not present

## 2022-08-12 DIAGNOSIS — H25812 Combined forms of age-related cataract, left eye: Secondary | ICD-10-CM | POA: Diagnosis not present

## 2022-08-21 ENCOUNTER — Telehealth: Payer: Self-pay | Admitting: Family Medicine

## 2022-08-21 NOTE — Telephone Encounter (Signed)
Copied from [place] [phone]. Topic: Medicare AWV >> Aug 21, 2022 11:59 AM Gillis Santa wrote: Reason for CRM: Called patient to schedule Medicare Annual Wellness Visit (AWV). Left message for patient to call back and schedule Medicare Annual Wellness Visit (AWV).  Last date of AWV: N/A  Please schedule an appointment at any time with Otila Kluver, [facility].  If any questions, please contact me at [phone].  Thank you ,  Shaune Pollack [facility] AWV TEAM Direct Dial [phone]

## 2022-09-04 ENCOUNTER — Telehealth: Payer: Self-pay | Admitting: Family Medicine

## 2022-09-04 NOTE — Telephone Encounter (Signed)
Patient requesting new referral be sent  to [facility] he is already scheduled with them for April 5th 2024 so they will need to receive the referral before that appointment in order  for him to  be seen.

## 2022-09-05 NOTE — Telephone Encounter (Signed)
Referral re faxed.

## 2022-09-05 NOTE — Telephone Encounter (Signed)
LMOVM with [facility] to call back and provide fax number.

## 2022-09-11 ENCOUNTER — Telehealth: Payer: Self-pay | Admitting: Family Medicine

## 2022-09-11 NOTE — Telephone Encounter (Signed)
Contacted Randall Williams to schedule their annual wellness visit. Patient declined to schedule AWV at this time.  Patient will call when able to schedule AWV.  [place] Direct Dial [phone]

## 2022-09-20 DIAGNOSIS — H903 Sensorineural hearing loss, bilateral: Secondary | ICD-10-CM | POA: Diagnosis not present

## 2022-10-09 ENCOUNTER — Ambulatory Visit (INDEPENDENT_AMBULATORY_CARE_PROVIDER_SITE_OTHER): Payer: Medicare Other | Admitting: Physician Assistant

## 2022-10-09 ENCOUNTER — Encounter: Payer: Self-pay | Admitting: Physician Assistant

## 2022-10-09 VITALS — BP 130/70 | HR 73 | Temp 98.0°F | Ht 62.0 in | Wt 143.0 lb

## 2022-10-09 DIAGNOSIS — H9201 Otalgia, right ear: Secondary | ICD-10-CM | POA: Diagnosis not present

## 2022-10-09 NOTE — Patient Instructions (Signed)
It was great to see you!  There is no evidence of infection  Please call us if you develop fever, redness, swelling or worsening pain  In the meantime, take tylenol and try cool compresses to the area  Take care,  Jarold Motto PA-C

## 2022-10-09 NOTE — Progress Notes (Signed)
Randall Williams is a 89 y.o. male here for a new problem.  History of Present Illness:   Chief Complaint  Patient presents with   Otalgia    Pt c/o right ear pain started Sunday, had sore throat but subsided. Denies fever or chills.    HPI  Right Ear Pain Reports experiencing intermittent right ear pain since 10/06/22. Sore throat resolved after doing salt gargle. Denies cough, fever, chills, n/v/d, discharge from ear. Describes pain as throbbing, rated 5-6/10. Taking Tylenol. History of stage 3 chronic kidney disease.  Symptoms are improving with time.  Past Medical History:  Diagnosis Date   Hypothyroidism 05/15/2016   Lymphoma of lymph nodes of face 2008   Small bowel obstruction 03/06/2014     Social History   Tobacco Use   Smoking status: Former    Types: Cigarettes    Quit date: 06/17/1968    Years since quitting: 54.3   Smokeless tobacco: Never  Substance Use Topics   Alcohol use: No   Drug use: No    Past Surgical History:  Procedure Laterality Date   LAPAROSCOPIC CHOLECYSTECTOMY  2005    No family history on file.  Allergies  Allergen Reactions   Synthroid [Levothyroxine Sodium] Hives and Rash    Current Medications:   Current Outpatient Medications:    Erythromycin 2 % PADS, Apply to affected area twice daily., Disp: 60 each, Rfl: 11   Multiple Vitamins-Minerals (CENTRUM SILVER 50+MEN) TABS, Take 1 tablet by mouth daily in the afternoon., Disp: , Rfl:    tamsulosin (FLOMAX) 0.4 MG CAPS capsule, TAKE ONE CAPSULE BY MOUTH EVERY EVENING AFTER SUPPER, Disp: 90 capsule, Rfl: 3   triamcinolone cream (KENALOG) 0.1 %, Apply 1 Application topically 2 (two) times daily. Apply to the lower front part of your legs, cover with the Gold Bond cream you have at home., Disp: 30 g, Rfl: 1   vitamin C (ASCORBIC ACID) 500 MG tablet, Take 500 mg by mouth daily., Disp: , Rfl:    Review of Systems:   Review of Systems  Constitutional:  Negative for fever and  malaise/fatigue.  HENT:  Positive for ear pain (Right). Negative for congestion and sore throat.   Eyes:  Negative for blurred vision.  Respiratory:  Negative for cough and shortness of breath.   Cardiovascular:  Negative for chest pain, palpitations and leg swelling.  Gastrointestinal:  Negative for vomiting.  Musculoskeletal:  Negative for back pain.  Skin:  Negative for rash.  Neurological:  Negative for loss of consciousness and headaches.    Vitals:   Vitals:   10/09/22 1345  BP: 130/70  Pulse: 73  Temp: 98 F (36.7 C)  TempSrc: Temporal  SpO2: 97%  Weight: 143 lb (64.9 kg)  Height:  (1.575 m)     Body mass index is 26.16 kg/m.  Physical Exam:   Physical Exam Vitals and nursing note reviewed.  Constitutional:      General: He is not in acute distress.    Appearance: He is well-developed. He is not ill-appearing or toxic-appearing.  HENT:     Head: Normocephalic and atraumatic.     Right Ear: Tympanic membrane, ear canal and external ear normal. Tympanic membrane is not erythematous, retracted or bulging.     Left Ear: Tympanic membrane, ear canal and external ear normal. Tympanic membrane is not erythematous, retracted or bulging.     Ears:     Comments: Slight tenderness mastoid area - no erythema/induration/swelling appreciated  Nose: Nose normal.     Right Sinus: No maxillary sinus tenderness or frontal sinus tenderness.     Left Sinus: No maxillary sinus tenderness or frontal sinus tenderness.     Mouth/Throat:     Pharynx: Uvula midline. No posterior oropharyngeal erythema.  Eyes:     General: Lids are normal.     Conjunctiva/sclera: Conjunctivae normal.  Neck:     Trachea: Trachea normal.  Cardiovascular:     Rate and Rhythm: Normal rate and regular rhythm.     Pulses: Normal pulses.     Heart sounds: Normal heart sounds, S1 normal and S2 normal.  Pulmonary:     Effort: Pulmonary effort is normal.     Breath sounds: Normal breath sounds. No  decreased breath sounds, wheezing, rhonchi or rales.  Lymphadenopathy:     Cervical: No cervical adenopathy.  Skin:    General: Skin is warm and dry.  Neurological:     Mental Status: He is alert.     GCS: GCS eye subscore is 4. GCS verbal subscore is 5. GCS motor subscore is 6.  Psychiatric:        Speech: Speech normal.        Behavior: Behavior normal. Behavior is cooperative.     Assessment and Plan:   Right ear pain No red flags on exam.   Recommend oral tylenol and cool/warm compresses. Symptoms are improving with time. Instructed patient as follows: Please call us if you develop fever, redness, swelling or worsening pain   I,Alexander Ruley,acting as a scribe for [employer], PA.,have documented all relevant documentation on the behalf of Jarold Motto, PA,as directed by  Jarold Motto, PA while in the presence of Jarold Motto, [place].   I, Jarold Motto, [place], have reviewed all documentation for this visit. The documentation on 10/09/22 for the exam, diagnosis, procedures, and orders are all accurate and complete.    Jarold Motto, PA-C

## 2022-11-19 DIAGNOSIS — H40023 Open angle with borderline findings, high risk, bilateral: Secondary | ICD-10-CM | POA: Diagnosis not present

## 2022-11-19 DIAGNOSIS — H2513 Age-related nuclear cataract, bilateral: Secondary | ICD-10-CM | POA: Diagnosis not present

## 2022-11-19 DIAGNOSIS — H353231 Exudative age-related macular degeneration, bilateral, with active choroidal neovascularization: Secondary | ICD-10-CM | POA: Diagnosis not present

## 2022-11-25 DIAGNOSIS — H353133 Nonexudative age-related macular degeneration, bilateral, advanced atrophic without subfoveal involvement: Secondary | ICD-10-CM | POA: Diagnosis not present

## 2022-11-25 DIAGNOSIS — H35372 Puckering of macula, left eye: Secondary | ICD-10-CM | POA: Diagnosis not present

## 2022-11-25 DIAGNOSIS — H2513 Age-related nuclear cataract, bilateral: Secondary | ICD-10-CM | POA: Diagnosis not present

## 2022-11-26 ENCOUNTER — Encounter: Payer: Self-pay | Admitting: Family Medicine

## 2022-12-10 DIAGNOSIS — H2513 Age-related nuclear cataract, bilateral: Secondary | ICD-10-CM | POA: Diagnosis not present

## 2022-12-10 DIAGNOSIS — H353114 Nonexudative age-related macular degeneration, right eye, advanced atrophic with subfoveal involvement: Secondary | ICD-10-CM | POA: Diagnosis not present

## 2022-12-10 DIAGNOSIS — H35372 Puckering of macula, left eye: Secondary | ICD-10-CM | POA: Diagnosis not present

## 2022-12-10 DIAGNOSIS — H353124 Nonexudative age-related macular degeneration, left eye, advanced atrophic with subfoveal involvement: Secondary | ICD-10-CM | POA: Diagnosis not present

## 2023-01-03 DIAGNOSIS — H52221 Regular astigmatism, right eye: Secondary | ICD-10-CM | POA: Diagnosis not present

## 2023-01-03 DIAGNOSIS — H25811 Combined forms of age-related cataract, right eye: Secondary | ICD-10-CM | POA: Diagnosis not present

## 2023-01-13 DIAGNOSIS — H2512 Age-related nuclear cataract, left eye: Secondary | ICD-10-CM | POA: Diagnosis not present

## 2023-01-13 DIAGNOSIS — H35372 Puckering of macula, left eye: Secondary | ICD-10-CM | POA: Diagnosis not present

## 2023-01-13 DIAGNOSIS — H353134 Nonexudative age-related macular degeneration, bilateral, advanced atrophic with subfoveal involvement: Secondary | ICD-10-CM | POA: Diagnosis not present

## 2023-02-04 DIAGNOSIS — H2512 Age-related nuclear cataract, left eye: Secondary | ICD-10-CM | POA: Diagnosis not present

## 2023-02-07 DIAGNOSIS — H25812 Combined forms of age-related cataract, left eye: Secondary | ICD-10-CM | POA: Diagnosis not present

## 2023-02-07 DIAGNOSIS — H353120 Nonexudative age-related macular degeneration, left eye, stage unspecified: Secondary | ICD-10-CM | POA: Diagnosis not present

## 2023-02-24 DIAGNOSIS — H35372 Puckering of macula, left eye: Secondary | ICD-10-CM | POA: Diagnosis not present

## 2023-02-24 DIAGNOSIS — H02836 Dermatochalasis of left eye, unspecified eyelid: Secondary | ICD-10-CM | POA: Diagnosis not present

## 2023-02-24 DIAGNOSIS — H353134 Nonexudative age-related macular degeneration, bilateral, advanced atrophic with subfoveal involvement: Secondary | ICD-10-CM | POA: Diagnosis not present

## 2023-02-24 DIAGNOSIS — H02833 Dermatochalasis of right eye, unspecified eyelid: Secondary | ICD-10-CM | POA: Diagnosis not present

## 2023-03-03 DIAGNOSIS — Z23 Encounter for immunization: Secondary | ICD-10-CM | POA: Diagnosis not present

## 2023-03-13 ENCOUNTER — Ambulatory Visit: Payer: Medicare Other | Admitting: Family Medicine

## 2023-03-13 ENCOUNTER — Encounter: Payer: Self-pay | Admitting: Family Medicine

## 2023-03-13 VITALS — BP 135/72 | HR 75 | Temp 98.0°F | Ht 62.0 in | Wt 133.2 lb

## 2023-03-13 DIAGNOSIS — L600 Ingrowing nail: Secondary | ICD-10-CM

## 2023-03-13 DIAGNOSIS — H9193 Unspecified hearing loss, bilateral: Secondary | ICD-10-CM | POA: Diagnosis not present

## 2023-03-13 NOTE — Progress Notes (Signed)
   Randall Williams is a 89 y.o. male who presents today for an office visit.  Assessment/Plan:  New/Acute Problems: Left Ingrown Toenail Elective Avulsion performed today.  See below procedure note.  Tolerated well.  Discussed care after.  If this continues to be an issue will refer to podiatry.  Decreased hearing Will refer to audiology.     Subjective:  HPI:  Patient here with left great toenail pain.  This is been present for several weeks.  He is worried about ingrown toenail.  Some redness there.  No specific treatments tried.  He has had ingrown nails in the past that was treated with removal.  He would like to have his toenail removed today if possible.       Objective:  Physical Exam: BP 135/72   Pulse 75   Temp 98 F (36.7 C) (Temporal)   Ht 5\' 2"  (1.575 m)   Wt 133 lb 3.2 oz (60.4 kg)   SpO2 98%   BMI 24.36 kg/m   Gen: No acute distress, resting comfortably CV: Regular rate and rhythm with no murmurs appreciated Pulm: Normal work of breathing, clear to auscultation bilaterally with no crackles, wheezes, or rhonchi Neuro: Grossly normal, moves all extremities Psych: Normal affect and thought content  Toenail Avulsion Procedure Note  Pre-operative Diagnosis: Left Ingrown Great toenail   Post-operative Diagnosis: Left Ingrown Great toenail  Indications: Therapeutic  Anesthesia: Lidocaine 1% without epinephrine without added sodium bicarbonate  Procedure Details  History of allergy to iodine: no  The risks (including bleeding and infection) and benefits of the  procedure and Verbal informed consent obtained.  After digital block anesthesia was obtained, a tourniquet was applied for hemostasis during the procedure.  After prepping with Betadine, the nail was freed from the nailbed and perionychium and removed with  forceps.  All visible granulation tissue is debrided. Antibiotic and bulky dressing was applied.   Findings: Ingrown  toenail  Complications: none.  Plan: 1. Soak the foot twice daily. Change dressing twice daily until healed over. 2. Warning signs of infection were reviewed.   3. Recommended that the patient use OTC analgesics as needed for pain.       Katina Degree. Jimmey Ralph, MD 03/13/2023 1:38 PM

## 2023-03-13 NOTE — Patient Instructions (Addendum)
It was very nice to see you today!  We took off your toenail today.  Please keep the pressure bandage in place for the next 24 hours.  After this, please keep a bandage on the area with topical antibiotic ointment.  You can soak it in Epsom salt solution or soapy water for 10 to 15 minutes a few times a day.  Let us know if you have any signs of infection including redness, drainage, or severe pain.  I will refer you to see an audiologist.  Return if symptoms worsen or fail to improve.   Take care, Dr Jimmey Ralph  PLEASE NOTE:  If you had any lab tests, please let us know if you have not heard back within a few days. You may see your results on mychart before we have a chance to review them but we will give you a call once they are reviewed by [place].   If we ordered any referrals today, please let us know if you have not heard from their office within the next week.   If you had any urgent prescriptions sent in today, please check with the pharmacy within an hour of our visit to make sure the prescription was transmitted appropriately.   Please try these tips to maintain a healthy lifestyle:  Eat at least 3 REAL meals and 1-2 snacks per day.  Aim for no more than 5 hours between eating.  If you eat breakfast, please do so within one hour of getting up.   Each meal should contain half fruits/vegetables, one quarter protein, and one quarter carbs (no bigger than a computer mouse)  Cut down on sweet beverages. This includes juice, soda, and sweet tea.   Drink at least 1 glass of water with each meal and aim for at least 8 glasses per day  Exercise at least 150 minutes every week.    Fingernail or Toenail Removal, Adult, Care After The following information offers guidance on how to care for yourself after your procedure. Your health care provider may also give you more specific instructions. If you have problems or questions, contact your health care provider. What can I expect after the  procedure? After the procedure, it is common to have: Pain. Redness. Swelling. Soreness. Drainage of germ-free (sterile) fluid from the nail bed. Follow these instructions at home: Medicines Take over-the-counter and prescription medicines only as told by your health care provider. If you were prescribed antibiotics, take or apply them as told by your health care provider. Do not stop using the antibiotic even if you start to feel better. Wound care Follow instructions from your health care provider about how to take care of your wound. Make sure you: Wash your hands with soap and water for at least 20 seconds before and after you change your bandage (dressing). If soap and water are not available, use hand sanitizer. Change your dressing as told by your health care provider. Leave stitches (sutures), skin glue, or adhesive strips in place. These skin closures may need to stay in place for 2 weeks or longer. If adhesive strip edges start to loosen and curl up, you may trim the loose edges. Do not remove adhesive strips completely unless your health care provider tells you to do that. Keep your dressing dry until your health care provider says it can be removed. Check your wound every day for signs of infection. Check for: More redness, swelling, or pain. More fluid or blood. Warmth. Pus or a bad smell.  If you have a splint:  Wear the splint as told by your health care provider. Remove it only as told by your health care provider. Loosen the splint if your fingers or toes tingle, become numb, or turn cold and blue. Keep the splint clean. If the splint is not waterproof: Do not let it get wet. Cover it with a watertight covering when you take a bath or shower. Managing pain, stiffness, and swelling Move your fingers or toes often to reduce stiffness and swelling. Raise (elevate) the injured area above the level of your heart while you are sitting or lying down. You may need to keep your  hand or foot elevated or supported on a pillow for 24 hours or as told by your health care provider. General instructions If you were given a shoe to wear, wear it as told by your health care provider. Keep all follow-up visits. Your health care provider will need to check your wound to see how it is healing. Contact a health care provider if: You have any of these signs of infection: You have more redness, swelling, or pain around your wound. You have more fluid or blood coming from your wound. Your wound feels warm to the touch. You have pus or a bad smell coming from your wound. You have a fever. Get help right away if: Your finger or toe looks pale, blue, or black. You are not able to move your finger or toe. Summary After the procedure, it is common to have pain and swelling. Keep the hand or foot elevated or supported on a pillow as told by your health care provider. Take over-the-counter and prescription medicines only as told by your health care provider. Check your wound every day for signs of infection. This information is not intended to replace advice given to you by your health care provider. Make sure you discuss any questions you have with your health care provider. Document Revised: 09/18/2021 Document Reviewed: 09/18/2021 Elsevier Patient Education  2024 [employer].

## 2023-04-07 DIAGNOSIS — H02836 Dermatochalasis of left eye, unspecified eyelid: Secondary | ICD-10-CM | POA: Diagnosis not present

## 2023-04-07 DIAGNOSIS — H353114 Nonexudative age-related macular degeneration, right eye, advanced atrophic with subfoveal involvement: Secondary | ICD-10-CM | POA: Diagnosis not present

## 2023-04-07 DIAGNOSIS — H353221 Exudative age-related macular degeneration, left eye, with active choroidal neovascularization: Secondary | ICD-10-CM | POA: Diagnosis not present

## 2023-04-07 DIAGNOSIS — Z961 Presence of intraocular lens: Secondary | ICD-10-CM | POA: Diagnosis not present

## 2023-04-07 DIAGNOSIS — H353124 Nonexudative age-related macular degeneration, left eye, advanced atrophic with subfoveal involvement: Secondary | ICD-10-CM | POA: Diagnosis not present

## 2023-04-07 DIAGNOSIS — H35372 Puckering of macula, left eye: Secondary | ICD-10-CM | POA: Diagnosis not present

## 2023-04-07 DIAGNOSIS — H02833 Dermatochalasis of right eye, unspecified eyelid: Secondary | ICD-10-CM | POA: Diagnosis not present

## 2023-04-21 ENCOUNTER — Ambulatory Visit: Payer: Medicare Other | Attending: Family Medicine | Admitting: Audiologist

## 2023-04-21 DIAGNOSIS — H903 Sensorineural hearing loss, bilateral: Secondary | ICD-10-CM | POA: Diagnosis not present

## 2023-04-21 NOTE — Procedures (Signed)
Outpatient Audiology and [facility] [address] [place], [place]  [address] [phone]  AUDIOLOGICAL  EVALUATION  NAME: Randall Williams     DOB:   1933-02-03      MRN: [id number]                                                                                     DATE: 04/21/2023     REFERENT: Ardith Dark, MD STATUS: Outpatient DIAGNOSIS: Sensorineural Hearing Loss Bilateral    History: Roshan was seen for an audiological evaluation due to concerns from his family that he is not hearing well even with the hearing aids on. He seems to not understand what they are saying. He has been to [employer] for aids, they are less than three years old. He has a hearing test in July and they updated his hearing aids, but he is still struggling. He said he hears a voice. It is unclear if it is the voice command from the hearing aids saying things like "volume up" or "low battery". He cannot clarify. Jermarcus was able to answer yes or no questions. When asked about cleaning the hearing aids he was unfamiliar with what needed to be done. His son, on the phone, said they review cleaning at every appointment at [facility].    Evaluation:  Otoscopy showed a slight view of the tympanic membranes with non occluding cerumen present, bilaterally Tympanometry results were consistent with normal middle ear function, bilaterally   Audiometric testing was completed using Conventional Audiometry techniques with insert earphones and TDH headphones. Test results are consistent with mild sloping to severe sensorineural hearing loss bilaterally. Speech Recognition Thresholds were obtained at  60dB HL in the right ear and at 55dB HL in the left ear. Had to use very slow presentation of live speech, he could not understand words using recorded voice. Word Recognition Testing was completed at 90dB HL and Floyd scored 48% in the right ear and 44% in the left ear, with very slow presentation of live voice.    Results:   The test results were reviewed with Wray and his daughter.  Kannan has a sloping mild to severe sensorineural hearing loss. At loud volumes he is still only understanding 40-50% of words. These are words spoken slowly in quiet. This shows even with this hearing aids, Benedict will likely understand less than 50% of what he hears.  The hearing aids are completely clogged with earwax. They need to be cleaned. He has essentially been wearing earplugs. There is no sound coming from the aids.  Altariq needs to be screened for a cognitive decline. His poor performance on speech testing at amplified levels, and forgetting to clean and maintain the aids, are a red flag for cognitive impairment. This was discussed in depth with daughter and Dalvin.    Recommendations: Go to [employer] and have hearing aids cleaned. The hearing aids are completely clogged with earwax. He has essentially been wearing earplugs. There is no sound coming from the aids. Family needs to start cleaning and checking aids for [facility].  Have hearing aid programming updated according to this test if significantly different than last test.  Recommend  seeing Dr. Jacquiline Doe for a cognitive screening due to change in ability to care for aids, and poor speech understanding even at amplified levels. Chesley consented to audiologist discussing cognitive change with Dr. Jimmey Ralph.    42 minutes spent testing and counseling on results.   If you have any questions please feel free to contact me at [phone].  Ammie Ferrier Au.D.  Audiologist  04/21/2023  3:28 PM  Cc: Ardith Dark, MD

## 2023-05-11 ENCOUNTER — Other Ambulatory Visit: Payer: Self-pay | Admitting: Family Medicine

## 2023-05-11 DIAGNOSIS — R350 Frequency of micturition: Secondary | ICD-10-CM

## 2023-06-17 ENCOUNTER — Encounter: Payer: Self-pay | Admitting: Family Medicine

## 2023-06-17 ENCOUNTER — Ambulatory Visit: Payer: Medicare Other | Admitting: Family Medicine

## 2023-06-17 VITALS — BP 125/67 | HR 72 | Temp 97.5°F | Ht 62.0 in | Wt 136.4 lb

## 2023-06-17 DIAGNOSIS — Z8639 Personal history of other endocrine, nutritional and metabolic disease: Secondary | ICD-10-CM

## 2023-06-17 DIAGNOSIS — L299 Pruritus, unspecified: Secondary | ICD-10-CM

## 2023-06-17 DIAGNOSIS — E785 Hyperlipidemia, unspecified: Secondary | ICD-10-CM

## 2023-06-17 DIAGNOSIS — N183 Chronic kidney disease, stage 3 unspecified: Secondary | ICD-10-CM

## 2023-06-17 DIAGNOSIS — R739 Hyperglycemia, unspecified: Secondary | ICD-10-CM

## 2023-06-17 DIAGNOSIS — R21 Rash and other nonspecific skin eruption: Secondary | ICD-10-CM

## 2023-06-17 LAB — LIPID PANEL
Cholesterol: 270 mg/dL — ABNORMAL HIGH (ref 0–200)
HDL: 52.2 mg/dL (ref >39.00)
LDL Cholesterol: 162 mg/dL — ABNORMAL HIGH (ref 0–99)
NonHDL: 217.68
Total CHOL/HDL Ratio: 5
Triglycerides: 276 mg/dL — ABNORMAL HIGH (ref 0.0–149.0)
VLDL: 55.2 mg/dL — ABNORMAL HIGH (ref 0.0–40.0)

## 2023-06-17 LAB — CBC
HCT: 25.2 % — ABNORMAL LOW (ref 39.0–52.0)
Hemoglobin: 8.5 g/dL — ABNORMAL LOW (ref 13.0–17.0)
MCHC: 33.7 g/dL (ref 30.0–36.0)
MCV: 91.5 fL (ref 78.0–100.0)
Platelets: 278 10*3/uL (ref 150.0–400.0)
RBC: 2.76 Mil/uL — ABNORMAL LOW (ref 4.22–5.81)
RDW: 15.1 % (ref 11.5–15.5)
WBC: 6.7 10*3/uL (ref 4.0–10.5)

## 2023-06-17 LAB — TSH: TSH: 9.68 u[IU]/mL — ABNORMAL HIGH (ref 0.35–5.50)

## 2023-06-17 LAB — COMPREHENSIVE METABOLIC PANEL
ALT: 12 U/L (ref 0–53)
AST: 19 U/L (ref 0–37)
Albumin: 3.1 g/dL — ABNORMAL LOW (ref 3.5–5.2)
Alkaline Phosphatase: 75 U/L (ref 39–117)
BUN: 50 mg/dL — ABNORMAL HIGH (ref 6–23)
CO2: 22 meq/L (ref 19–32)
Calcium: 8.6 mg/dL (ref 8.4–10.5)
Chloride: 111 meq/L (ref 96–112)
Creatinine, Ser: 2.34 mg/dL — ABNORMAL HIGH (ref 0.40–1.50)
GFR: 24.2 mL/min — ABNORMAL LOW (ref >60.00)
Glucose, Bld: 96 mg/dL (ref 70–99)
Potassium: 5.5 meq/L — ABNORMAL HIGH (ref 3.5–5.1)
Sodium: 137 meq/L (ref 135–145)
Total Bilirubin: 0.3 mg/dL (ref 0.2–1.2)
Total Protein: 6.2 g/dL (ref 6.0–8.3)

## 2023-06-17 LAB — HEMOGLOBIN A1C: Hgb A1c MFr Bld: 6.1 % (ref 4.6–6.5)

## 2023-06-17 MED ORDER — HYDROXYZINE HCL 10 MG PO TABS: 10.0000 mg | ORAL_TABLET | Freq: Three times a day (TID) | ORAL | 0 refills | Status: DC | PRN

## 2023-06-17 MED ORDER — TRIAMCINOLONE ACETONIDE 0.5 % EX OINT: 1.0000 | TOPICAL_OINTMENT | Freq: Two times a day (BID) | CUTANEOUS | 0 refills | Status: DC

## 2023-06-17 NOTE — Patient Instructions (Signed)
 It was very nice to see you today!  Please use Sarna lotion multiple times per day to help with your itching.  Use the higher strength triamcinolone  to areas that are very inflamed.  You can use hydroxyzine  at night as well to help with itching.  Will check blood work.  Let us  know if not proving in the next 1 to 2 weeks.  Return if symptoms worsen or fail to improve.   Take care, Dr Kennyth  PLEASE NOTE:  If you had any lab tests, please let us  know if you have not heard back within a few days. You may see your results on mychart before we have a chance to review them but we will give you a call once they are reviewed by us .   If we ordered any referrals today, please let us  know if you have not heard from their office within the next week.   If you had any urgent prescriptions sent in today, please check with the pharmacy within an hour of our visit to make sure the prescription was transmitted appropriately.   Please try these tips to maintain a healthy lifestyle:  Eat at least 3 REAL meals and 1-2 snacks per day.  Aim for no more than 5 hours between eating.  If you eat breakfast, please do so within one hour of getting up.   Each meal should contain half fruits/vegetables, one quarter protein, and one quarter carbs (no bigger than a computer mouse)  Cut down on sweet beverages. This includes juice, soda, and sweet tea.   Drink at least 1 glass of water with each meal and aim for at least 8 glasses per day  Exercise at least 150 minutes every week.

## 2023-06-17 NOTE — Assessment & Plan Note (Signed)
Check lipids 

## 2023-06-17 NOTE — Progress Notes (Signed)
   Randall Williams is a 89 y.o. male who presents today for an office visit.  Assessment/Plan:  New/Acute Problems: Rash/pruritus No red flags.  Likely due to dermatitis related to xerosis cutis.  He does have a few small excoriations as well however no signs of urticaria.  We discussed importance of twice daily emollients.  Recommended trial of Sarna lotion as well.  Will send in higher strength triamcinolone  to use as needed to areas that are very pruritic.  We will also start low-dose hydroxyzine  10 mg 3 times daily as needed for itching.  They will let us  know if not improving in the next 1 to 2 weeks.  Will check labs today per patient request  Chronic Problems Addressed Today: History of hypothyroidism Check TSH.   Dyslipidemia Check lipids.  CKD (chronic kidney disease) stage 3, GFR 30-59 ml/min (HCC) Check be met.     Subjective:  HPI:  See Assessment / plan for status of chronic conditions.  Patient is here with his wife today.  Primary concern is pruritic rash on legs and back.  Started about 4 days ago.  Initially located on legs.  Tried using triamcinolone  cream without much improvement.  No obvious new exposures.  No soaps or detergents.  No other treatments tried.  Progressed to involving his back over the last day or so.  No fevers or chills.       Objective:  Physical Exam: BP 125/67   Pulse 72   Temp (!) 97.5 F (36.4 C) (Temporal)   Ht 5' 2 (1.575 m)   Wt 136 lb 6.4 oz (61.9 kg)   SpO2 98%   BMI 24.95 kg/m   Gen: No acute distress, resting comfortably CV: Regular rate and rhythm with no murmurs appreciated Pulm: Normal work of breathing, clear to auscultation bilaterally with no crackles, wheezes, or rhonchi Skin: Xerosis cutis noted on bilateral lower legs and back.  A few small erythematous excoriations noted.  No papular lesions, no macules.  No urticaria. Neuro: Grossly normal, moves all extremities Psych: Normal affect and thought content       Braylei Totino M. Kennyth, MD 06/17/2023 12:39 PM

## 2023-06-17 NOTE — Assessment & Plan Note (Signed)
Check TSH 

## 2023-06-17 NOTE — Assessment & Plan Note (Signed)
Check be met.

## 2023-06-19 DIAGNOSIS — H353231 Exudative age-related macular degeneration, bilateral, with active choroidal neovascularization: Secondary | ICD-10-CM | POA: Diagnosis not present

## 2023-06-19 DIAGNOSIS — Z961 Presence of intraocular lens: Secondary | ICD-10-CM | POA: Diagnosis not present

## 2023-06-19 DIAGNOSIS — H40023 Open angle with borderline findings, high risk, bilateral: Secondary | ICD-10-CM | POA: Diagnosis not present

## 2023-06-23 NOTE — Progress Notes (Signed)
 His blood counts have dropped quite a bit since last time.  Please have him come back to recheck CBC, iron  panel, and B12.  His kidney numbers are about the same as last year however this is something we need to keep a close eye on.  Recommend referral to nephrology.  He should come back for us  to recheck numbers.  Please place future order for CMET.  His thyroid  levels are off.  This may be contributing to his itching as well.  Please have him come back to recheck TSH, free T4, and free T3.  The rest of his labs are all stable compared to previous and we can recheck again in a year.

## 2023-06-25 ENCOUNTER — Other Ambulatory Visit: Payer: Self-pay | Admitting: *Deleted

## 2023-06-25 ENCOUNTER — Telehealth: Payer: Self-pay | Admitting: *Deleted

## 2023-06-25 DIAGNOSIS — R944 Abnormal results of kidney function studies: Secondary | ICD-10-CM

## 2023-06-25 DIAGNOSIS — D72818 Other decreased white blood cell count: Secondary | ICD-10-CM

## 2023-06-25 DIAGNOSIS — R799 Abnormal finding of blood chemistry, unspecified: Secondary | ICD-10-CM

## 2023-06-25 DIAGNOSIS — E038 Other specified hypothyroidism: Secondary | ICD-10-CM

## 2023-06-25 NOTE — Telephone Encounter (Signed)
 Copied from CRM [id number]. Topic: Clinical - Lab/Test Results >> Jun 25, 2023  1:28 PM Robinson H wrote: Reason for CRM: Patient is calling back to speak with [facility] regarding lab results, please reach out to patient.  Nathanyl [phone]    See results note  Elora KRAFT

## 2023-06-27 ENCOUNTER — Other Ambulatory Visit (INDEPENDENT_AMBULATORY_CARE_PROVIDER_SITE_OTHER): Payer: Medicare Other

## 2023-06-27 DIAGNOSIS — R799 Abnormal finding of blood chemistry, unspecified: Secondary | ICD-10-CM

## 2023-06-27 DIAGNOSIS — E038 Other specified hypothyroidism: Secondary | ICD-10-CM | POA: Diagnosis not present

## 2023-06-27 LAB — COMPREHENSIVE METABOLIC PANEL
ALT: 14 U/L (ref 0–53)
AST: 19 U/L (ref 0–37)
Albumin: 3.1 g/dL — ABNORMAL LOW (ref 3.5–5.2)
Alkaline Phosphatase: 72 U/L (ref 39–117)
BUN: 45 mg/dL — ABNORMAL HIGH (ref 6–23)
CO2: 22 meq/L (ref 19–32)
Calcium: 8.7 mg/dL (ref 8.4–10.5)
Chloride: 111 meq/L (ref 96–112)
Creatinine, Ser: 2.43 mg/dL — ABNORMAL HIGH (ref 0.40–1.50)
GFR: 23.13 mL/min — ABNORMAL LOW (ref >60.00)
Glucose, Bld: 90 mg/dL (ref 70–99)
Potassium: 5.3 meq/L — ABNORMAL HIGH (ref 3.5–5.1)
Sodium: 136 meq/L (ref 135–145)
Total Bilirubin: 0.3 mg/dL (ref 0.2–1.2)
Total Protein: 6.4 g/dL (ref 6.0–8.3)

## 2023-06-27 LAB — CBC
HCT: 27 % — ABNORMAL LOW (ref 39.0–52.0)
Hemoglobin: 8.7 g/dL — ABNORMAL LOW (ref 13.0–17.0)
MCHC: 32.3 g/dL (ref 30.0–36.0)
MCV: 91.5 fL (ref 78.0–100.0)
Platelets: 286 10*3/uL (ref 150.0–400.0)
RBC: 2.95 Mil/uL — ABNORMAL LOW (ref 4.22–5.81)
RDW: 15.6 % — ABNORMAL HIGH (ref 11.5–15.5)
WBC: 6.4 10*3/uL (ref 4.0–10.5)

## 2023-06-27 LAB — T4, FREE: Free T4: 0.81 ng/dL (ref 0.60–1.60)

## 2023-06-27 LAB — T3, FREE: T3, Free: 2.4 pg/mL (ref 2.3–4.2)

## 2023-06-27 LAB — TSH: TSH: 12.43 u[IU]/mL — ABNORMAL HIGH (ref 0.35–5.50)

## 2023-06-28 LAB — IRON,TIBC AND FERRITIN PANEL
%SAT: 17 % — ABNORMAL LOW (ref 20–48)
Ferritin: 24 ng/mL (ref 24–380)
Iron: 49 ug/dL — ABNORMAL LOW (ref 50–180)
TIBC: 296 ug/dL (ref 250–425)

## 2023-06-28 LAB — VITAMIN B12: Vitamin B-12: 565 pg/mL (ref 200–1100)

## 2023-06-30 ENCOUNTER — Encounter: Payer: Self-pay | Admitting: Family Medicine

## 2023-06-30 DIAGNOSIS — E038 Other specified hypothyroidism: Secondary | ICD-10-CM | POA: Insufficient documentation

## 2023-06-30 NOTE — Progress Notes (Signed)
 His iron  is low.  I am concerned that he may be losing iron .  Recommend that we check an FOBT and have him follow-up with gastroenterology.  He needs to start an iron  supplement ferrous sulfate  65 mg every other day on an empty stomach.  We should recheck this in 3 to 6 months however it is very important for him to follow-up with GI to make sure that he is not having any internal bleeding.  His kidney function is stable.  We do not need to do any other testing for this at this point however it is important he follow-up with nephrology for this.  His thyroid  level is stable.  We can recheck again in a few months.

## 2023-07-02 ENCOUNTER — Other Ambulatory Visit: Payer: Self-pay | Admitting: *Deleted

## 2023-07-02 ENCOUNTER — Telehealth: Payer: Self-pay

## 2023-07-02 DIAGNOSIS — E611 Iron deficiency: Secondary | ICD-10-CM

## 2023-07-02 NOTE — Telephone Encounter (Signed)
 Copied from CRM [id number]. Topic: Clinical - Lab/Test Results >> Jul 02, 2023  3:27 PM Adaysia C wrote: Reason for CRM: Patient returned call from [facility] regarding lab results. Please follow up with patient [phone]

## 2023-07-03 NOTE — Telephone Encounter (Signed)
Patient missed previous call . States they will available for phone call tomorrow anytime after 9am

## 2023-07-04 NOTE — Telephone Encounter (Signed)
Copied from CRM [id number]. Topic: General - Call Back - No Documentation >> Jul 04, 2023  9:45 AM Drema Balzarine wrote: Reason for CRM: Patient returning [place] call from this morning   Spoke with patient, aware need to come to the office to pick up test  Verbalized understanding

## 2023-07-08 ENCOUNTER — Other Ambulatory Visit: Payer: Medicare Other

## 2023-07-09 ENCOUNTER — Other Ambulatory Visit (INDEPENDENT_AMBULATORY_CARE_PROVIDER_SITE_OTHER): Payer: Medicare Other

## 2023-07-09 DIAGNOSIS — E611 Iron deficiency: Secondary | ICD-10-CM | POA: Diagnosis not present

## 2023-07-09 LAB — FECAL OCCULT BLOOD, IMMUNOCHEMICAL: Fecal Occult Bld: NEGATIVE

## 2023-07-10 ENCOUNTER — Encounter: Payer: Self-pay | Admitting: Family Medicine

## 2023-07-10 NOTE — Progress Notes (Signed)
Stool sample is normal.  No signs of blood loss.  Recommend he come back in 3 months to recheck his iron levels.

## 2023-07-17 ENCOUNTER — Encounter: Payer: Self-pay | Admitting: Family Medicine

## 2023-07-17 ENCOUNTER — Ambulatory Visit (INDEPENDENT_AMBULATORY_CARE_PROVIDER_SITE_OTHER): Payer: Medicare Other | Admitting: Family Medicine

## 2023-07-17 VITALS — BP 128/71 | HR 71 | Temp 97.5°F | Ht 62.0 in | Wt 135.4 lb

## 2023-07-17 DIAGNOSIS — N183 Chronic kidney disease, stage 3 unspecified: Secondary | ICD-10-CM | POA: Diagnosis not present

## 2023-07-17 DIAGNOSIS — J302 Other seasonal allergic rhinitis: Secondary | ICD-10-CM | POA: Diagnosis not present

## 2023-07-17 DIAGNOSIS — Z8639 Personal history of other endocrine, nutritional and metabolic disease: Secondary | ICD-10-CM | POA: Diagnosis not present

## 2023-07-17 DIAGNOSIS — E611 Iron deficiency: Secondary | ICD-10-CM

## 2023-07-17 DIAGNOSIS — E038 Other specified hypothyroidism: Secondary | ICD-10-CM | POA: Diagnosis not present

## 2023-07-17 MED ORDER — LEVOTHYROXINE SODIUM 25 MCG PO TABS: 25.0000 ug | ORAL_TABLET | Freq: Every day | ORAL | 3 refills | Status: DC

## 2023-07-17 MED ORDER — DESONIDE 0.05 % EX CREA: TOPICAL_CREAM | Freq: Two times a day (BID) | CUTANEOUS | 0 refills | Status: DC

## 2023-07-17 NOTE — Assessment & Plan Note (Signed)
No red flags.  Recommended over-the-counter antihistamine such as Claritin or Zyrtec as above.  He will let us know if not improving.

## 2023-07-17 NOTE — Assessment & Plan Note (Signed)
Last TSH 12.43 however normal T4 and T3.  Possible that his subclinical hyperthyroidism could be contributing to his pruritus.  He was on Synthroid in the past.  This is listed in his chart as an allergy however he and his wife do not remember having any allergic reaction to this in the past.  We will restart today and they will let us know if he has any side effects.  Recheck TSH in 4 to 6 weeks.

## 2023-07-17 NOTE — Assessment & Plan Note (Signed)
Referral to nephrology is pending.  Last GFR is 23.  Encouraged good hydration.  We need to avoid nephrotoxic medications.

## 2023-07-17 NOTE — Progress Notes (Signed)
   Randall Williams is a 89 y.o. male who presents today for an office visit.  Assessment/Plan:  New/Acute Problems: Pruritus  Multifactorial.  He does have xerosis cutis which is likely contributing though also was recently found to have iron deficiency as well as subclinical hyperthyroidism which are likely contributing as well.  We are addressing both of these issues as below.  We did discuss importance of continuing with daily emollients.  Will send in desonide for areas that are very pruritic.  Also recommended over-the-counter antihistamine such as Claritin or Zyrtec.  He will follow-up with [place] in a few weeks.  If symptoms persist despite above may consider referral to dermatology.  Chronic Problems Addressed Today: Iron deficiency His FOBT was negative.  May be related to his CKD.  He started iron supplement for the last few weeks and has been tolerating well.  He will come back soon to recheck.  If iron levels and hemoglobin do not improve with oral repletion will need to be referred to hematology.  Subclinical hypothyroidism Last TSH 12.43 however normal T4 and T3.  Possible that his subclinical hyperthyroidism could be contributing to his pruritus.  He was on Synthroid in the past.  This is listed in his chart as an allergy however he and his wife do not remember having any allergic reaction to this in the past.  We will restart today and they will let us know if he has any side effects.  Recheck TSH in 4 to 6 weeks.  CKD (chronic kidney disease) stage 3, GFR 30-59 ml/min (HCC) Referral to nephrology is pending.  Last GFR is 23.  Encouraged good hydration.  We need to avoid nephrotoxic medications.  Seasonal rhinitis No red flags.  Recommended over-the-counter antihistamine such as Claritin or Zyrtec as above.  He will let us know if not improving.     Subjective:  HPI:  See A/P for status of chronic conditions.  Patient is here today for follow-up for pruritus.  I saw him about a  month ago for this.  At that time we sent in triamcinolone and hydroxyzine.  Also discussed emollients.  We did check labs at that time which showed iron deficiency anemia and CKD stage IV with a GFR of 23.  He was noted to have elevated TSH however T3 and T4 were normal.  His pruritus has slightly improved though still persistent.  He felt like the triamcinolone and hydroxyzine did give some modest benefit.  Still has a lot of dry skin.  He has been taking iron supplement for the last several weeks.  He has not yet heard from the nephrologist.    He is getting occasional rhinorrhea.  Clear in color.  No facial pain or pressure.  No fevers or chills.  No specific treatments tried for this.       Objective:  Physical Exam: BP 128/71   Pulse 71   Temp (!) 97.5 F (36.4 C) (Temporal)   Ht 5\' 2"  (1.575 m)   Wt 135 lb 6.4 oz (61.4 kg)   SpO2 99%   BMI 24.76 kg/m   Gen: No acute distress, resting comfortably Skin: Xerosis cutis on arms and legs with a few excoriations noted.  No urticaria. Neuro: Grossly normal, moves all extremities Psych: Normal affect and thought content      Titus Drone M. Jimmey Ralph, MD 07/17/2023 9:26 AM

## 2023-07-17 NOTE — Patient Instructions (Addendum)
It was very nice to see you today!  Please continue with the iron supplementation.    Please restart your thyroid medicine.  You can try taking Claritin or Zyrtec to help with your runny nose and itching.  Please come back in 4 weeks for repeat labs.   Return in about 4 weeks (around 08/14/2023).   Take care, Dr Jimmey Ralph  PLEASE NOTE:  If you had any lab tests, please let us know if you have not heard back within a few days. You may see your results on mychart before we have a chance to review them but we will give you a call once they are reviewed by [place].   If we ordered any referrals today, please let us know if you have not heard from their office within the next week.   If you had any urgent prescriptions sent in today, please check with the pharmacy within an hour of our visit to make sure the prescription was transmitted appropriately.   Please try these tips to maintain a healthy lifestyle:  Eat at least 3 REAL meals and 1-2 snacks per day.  Aim for no more than 5 hours between eating.  If you eat breakfast, please do so within one hour of getting up.   Each meal should contain half fruits/vegetables, one quarter protein, and one quarter carbs (no bigger than a computer mouse)  Cut down on sweet beverages. This includes juice, soda, and sweet tea.   Drink at least 1 glass of water with each meal and aim for at least 8 glasses per day  Exercise at least 150 minutes every week.

## 2023-07-17 NOTE — Assessment & Plan Note (Signed)
His FOBT was negative.  May be related to his CKD.  He started iron supplement for the last few weeks and has been tolerating well.  He will come back soon to recheck.  If iron levels and hemoglobin do not improve with oral repletion will need to be referred to hematology.

## 2023-09-10 ENCOUNTER — Other Ambulatory Visit: Payer: Self-pay | Admitting: Family Medicine

## 2023-09-22 DIAGNOSIS — H353231 Exudative age-related macular degeneration, bilateral, with active choroidal neovascularization: Secondary | ICD-10-CM | POA: Diagnosis not present

## 2023-09-22 DIAGNOSIS — H40023 Open angle with borderline findings, high risk, bilateral: Secondary | ICD-10-CM | POA: Diagnosis not present

## 2023-09-22 DIAGNOSIS — Z961 Presence of intraocular lens: Secondary | ICD-10-CM | POA: Diagnosis not present

## 2023-11-24 ENCOUNTER — Emergency Department (HOSPITAL_COMMUNITY)

## 2023-11-24 ENCOUNTER — Encounter (HOSPITAL_COMMUNITY): Payer: Self-pay | Admitting: Emergency Medicine

## 2023-11-24 ENCOUNTER — Inpatient Hospital Stay (HOSPITAL_COMMUNITY)

## 2023-11-24 ENCOUNTER — Inpatient Hospital Stay (HOSPITAL_COMMUNITY)
Admission: EM | Admit: 2023-11-24 | Discharge: 2023-11-26 | DRG: 389 | Disposition: A | Discharge status: Home / Self Care | Attending: Internal Medicine | Admitting: Internal Medicine

## 2023-11-24 ENCOUNTER — Other Ambulatory Visit: Payer: Self-pay

## 2023-11-24 DIAGNOSIS — R188 Other ascites: Secondary | ICD-10-CM | POA: Diagnosis not present

## 2023-11-24 DIAGNOSIS — K5730 Diverticulosis of large intestine without perforation or abscess without bleeding: Secondary | ICD-10-CM | POA: Diagnosis not present

## 2023-11-24 DIAGNOSIS — E785 Hyperlipidemia, unspecified: Secondary | ICD-10-CM | POA: Diagnosis present

## 2023-11-24 DIAGNOSIS — Z87891 Personal history of nicotine dependence: Secondary | ICD-10-CM | POA: Diagnosis not present

## 2023-11-24 DIAGNOSIS — E871 Hypo-osmolality and hyponatremia: Secondary | ICD-10-CM | POA: Diagnosis present

## 2023-11-24 DIAGNOSIS — Z79890 Hormone replacement therapy (postmenopausal): Secondary | ICD-10-CM | POA: Diagnosis not present

## 2023-11-24 DIAGNOSIS — E875 Hyperkalemia: Secondary | ICD-10-CM | POA: Diagnosis present

## 2023-11-24 DIAGNOSIS — N400 Enlarged prostate without lower urinary tract symptoms: Secondary | ICD-10-CM | POA: Diagnosis not present

## 2023-11-24 DIAGNOSIS — D631 Anemia in chronic kidney disease: Secondary | ICD-10-CM | POA: Diagnosis not present

## 2023-11-24 DIAGNOSIS — Z888 Allergy status to other drugs, medicaments and biological substances status: Secondary | ICD-10-CM

## 2023-11-24 DIAGNOSIS — H3530 Unspecified macular degeneration: Secondary | ICD-10-CM | POA: Diagnosis not present

## 2023-11-24 DIAGNOSIS — Z8719 Personal history of other diseases of the digestive system: Secondary | ICD-10-CM | POA: Diagnosis not present

## 2023-11-24 DIAGNOSIS — R011 Cardiac murmur, unspecified: Secondary | ICD-10-CM | POA: Diagnosis not present

## 2023-11-24 DIAGNOSIS — E872 Acidosis, unspecified: Secondary | ICD-10-CM | POA: Diagnosis present

## 2023-11-24 DIAGNOSIS — N179 Acute kidney failure, unspecified: Secondary | ICD-10-CM | POA: Diagnosis not present

## 2023-11-24 DIAGNOSIS — R1013 Epigastric pain: Secondary | ICD-10-CM | POA: Diagnosis not present

## 2023-11-24 DIAGNOSIS — Z4682 Encounter for fitting and adjustment of non-vascular catheter: Secondary | ICD-10-CM | POA: Diagnosis not present

## 2023-11-24 DIAGNOSIS — N184 Chronic kidney disease, stage 4 (severe): Secondary | ICD-10-CM | POA: Diagnosis not present

## 2023-11-24 DIAGNOSIS — Z79899 Other long term (current) drug therapy: Secondary | ICD-10-CM

## 2023-11-24 DIAGNOSIS — K4090 Unilateral inguinal hernia, without obstruction or gangrene, not specified as recurrent: Secondary | ICD-10-CM | POA: Diagnosis not present

## 2023-11-24 DIAGNOSIS — D509 Iron deficiency anemia, unspecified: Secondary | ICD-10-CM | POA: Diagnosis not present

## 2023-11-24 DIAGNOSIS — K56609 Unspecified intestinal obstruction, unspecified as to partial versus complete obstruction: Secondary | ICD-10-CM | POA: Diagnosis present

## 2023-11-24 DIAGNOSIS — E860 Dehydration: Secondary | ICD-10-CM | POA: Diagnosis present

## 2023-11-24 DIAGNOSIS — K6389 Other specified diseases of intestine: Secondary | ICD-10-CM | POA: Diagnosis not present

## 2023-11-24 DIAGNOSIS — K5900 Constipation, unspecified: Secondary | ICD-10-CM | POA: Diagnosis not present

## 2023-11-24 DIAGNOSIS — R100 Acute abdomen: Secondary | ICD-10-CM | POA: Diagnosis not present

## 2023-11-24 DIAGNOSIS — Z9049 Acquired absence of other specified parts of digestive tract: Secondary | ICD-10-CM

## 2023-11-24 DIAGNOSIS — Z8572 Personal history of non-Hodgkin lymphomas: Secondary | ICD-10-CM

## 2023-11-24 DIAGNOSIS — E039 Hypothyroidism, unspecified: Secondary | ICD-10-CM | POA: Diagnosis present

## 2023-11-24 DIAGNOSIS — K5669 Other partial intestinal obstruction: Secondary | ICD-10-CM | POA: Diagnosis not present

## 2023-11-24 DIAGNOSIS — I10 Essential (primary) hypertension: Secondary | ICD-10-CM | POA: Diagnosis not present

## 2023-11-24 LAB — URINALYSIS, ROUTINE W REFLEX MICROSCOPIC
Bacteria, UA: NONE SEEN
Bilirubin Urine: NEGATIVE
Glucose, UA: 150 mg/dL — AB
Hgb urine dipstick: NEGATIVE
Ketones, ur: NEGATIVE mg/dL
Leukocytes,Ua: NEGATIVE
Nitrite: NEGATIVE
Protein, ur: >=300 mg/dL — AB
Specific Gravity, Urine: 1.014 (ref 1.005–1.030)
pH: 6 (ref 5.0–8.0)

## 2023-11-24 LAB — COMPREHENSIVE METABOLIC PANEL WITH GFR
ALT: 16 U/L (ref 0–44)
AST: 32 U/L (ref 15–41)
Albumin: 2.9 g/dL — ABNORMAL LOW (ref 3.5–5.0)
Alkaline Phosphatase: 77 U/L (ref 38–126)
Anion gap: 8 (ref 5–15)
BUN: 55 mg/dL — ABNORMAL HIGH (ref 8–23)
CO2: 20 mmol/L — ABNORMAL LOW (ref 22–32)
Calcium: 8.9 mg/dL (ref 8.9–10.3)
Chloride: 104 mmol/L (ref 98–111)
Creatinine, Ser: 3.03 mg/dL — ABNORMAL HIGH (ref 0.61–1.24)
GFR, Estimated: 19 mL/min — ABNORMAL LOW (ref >60)
Glucose, Bld: 172 mg/dL — ABNORMAL HIGH (ref 70–99)
Potassium: 5.8 mmol/L — ABNORMAL HIGH (ref 3.5–5.1)
Sodium: 132 mmol/L — ABNORMAL LOW (ref 135–145)
Total Bilirubin: 1.1 mg/dL (ref 0.0–1.2)
Total Protein: 6.9 g/dL (ref 6.5–8.1)

## 2023-11-24 LAB — CBC
HCT: 28.9 % — ABNORMAL LOW (ref 39.0–52.0)
Hemoglobin: 9.1 g/dL — ABNORMAL LOW (ref 13.0–17.0)
MCH: 29.5 pg (ref 26.0–34.0)
MCHC: 31.5 g/dL (ref 30.0–36.0)
MCV: 93.8 fL (ref 80.0–100.0)
Platelets: 318 10*3/uL (ref 150–400)
RBC: 3.08 MIL/uL — ABNORMAL LOW (ref 4.22–5.81)
RDW: 14.6 % (ref 11.5–15.5)
WBC: 8.8 10*3/uL (ref 4.0–10.5)
nRBC: 0 % (ref 0.0–0.2)

## 2023-11-24 LAB — BASIC METABOLIC PANEL WITH GFR
Anion gap: 7 (ref 5–15)
BUN: 52 mg/dL — ABNORMAL HIGH (ref 8–23)
CO2: 18 mmol/L — ABNORMAL LOW (ref 22–32)
Calcium: 8.3 mg/dL — ABNORMAL LOW (ref 8.9–10.3)
Chloride: 109 mmol/L (ref 98–111)
Creatinine, Ser: 2.74 mg/dL — ABNORMAL HIGH (ref 0.61–1.24)
GFR, Estimated: 21 mL/min — ABNORMAL LOW
Glucose, Bld: 133 mg/dL — ABNORMAL HIGH (ref 70–99)
Potassium: 5.3 mmol/L — ABNORMAL HIGH (ref 3.5–5.1)
Sodium: 134 mmol/L — ABNORMAL LOW (ref 135–145)

## 2023-11-24 LAB — LIPASE, BLOOD: Lipase: 47 U/L (ref 11–51)

## 2023-11-24 MED ORDER — TAMSULOSIN HCL 0.4 MG PO CAPS
0.4000 mg | ORAL_CAPSULE | Freq: Every day | ORAL | Status: DC
  Administered 2023-11-25: 0.4 mg via ORAL
  Filled 2023-11-24: qty 1

## 2023-11-24 MED ORDER — IOHEXOL 300 MG/ML  SOLN: 100.0000 mL | Freq: Once | INTRAMUSCULAR | Status: DC | PRN

## 2023-11-24 MED ORDER — ONDANSETRON HCL 4 MG/2ML IJ SOLN: 4.0000 mg | Freq: Once | INTRAMUSCULAR | Status: DC | PRN

## 2023-11-24 MED ORDER — FENTANYL CITRATE PF 50 MCG/ML IJ SOSY
50.0000 ug | PREFILLED_SYRINGE | Freq: Once | INTRAMUSCULAR | Status: AC
  Administered 2023-11-24: 50 ug via INTRAVENOUS
  Filled 2023-11-24: qty 1

## 2023-11-24 MED ORDER — HEPARIN SODIUM (PORCINE) 5000 UNIT/ML IJ SOLN
5000.0000 [IU] | Freq: Three times a day (TID) | INTRAMUSCULAR | Status: DC
  Administered 2023-11-24 – 2023-11-26 (×6): 5000 [IU] via SUBCUTANEOUS
  Filled 2023-11-24 (×6): qty 1

## 2023-11-24 MED ORDER — ONDANSETRON HCL 4 MG/2ML IJ SOLN: 4.0000 mg | Freq: Four times a day (QID) | INTRAMUSCULAR | Status: DC | PRN

## 2023-11-24 MED ORDER — DIATRIZOATE MEGLUMINE & SODIUM 66-10 % PO SOLN
90.0000 mL | Freq: Once | ORAL | Status: AC
  Administered 2023-11-24: 90 mL via NASOGASTRIC
  Filled 2023-11-24: qty 90

## 2023-11-24 MED ORDER — ACETAMINOPHEN 325 MG PO TABS: 650.0000 mg | ORAL_TABLET | Freq: Four times a day (QID) | ORAL | Status: DC | PRN

## 2023-11-24 MED ORDER — HYDROMORPHONE HCL 1 MG/ML IJ SOLN: 1.0000 mg | INTRAMUSCULAR | Status: DC | PRN

## 2023-11-24 MED ORDER — SODIUM CHLORIDE 0.9 % IV BOLUS
1000.0000 mL | Freq: Once | INTRAVENOUS | Status: AC
  Administered 2023-11-24: 1000 mL via INTRAVENOUS

## 2023-11-24 MED ORDER — ALBUTEROL SULFATE (2.5 MG/3ML) 0.083% IN NEBU: 2.5000 mg | INHALATION_SOLUTION | RESPIRATORY_TRACT | Status: DC | PRN

## 2023-11-24 MED ORDER — SODIUM CHLORIDE 0.9 % IV SOLN: INTRAVENOUS | Status: AC

## 2023-11-24 MED ORDER — ACETAMINOPHEN 650 MG RE SUPP: 650.0000 mg | Freq: Four times a day (QID) | RECTAL | Status: DC | PRN

## 2023-11-24 MED ORDER — SODIUM ZIRCONIUM CYCLOSILICATE 10 G PO PACK
10.0000 g | PACK | Freq: Once | ORAL | Status: AC
  Administered 2023-11-24: 10 g via ORAL
  Filled 2023-11-24: qty 1

## 2023-11-24 NOTE — ED Provider Notes (Signed)
 [facility] AT [facility] Provider Note   CSN: [id number] Arrival date & time: 11/24/23  4098     History Chief Complaint  Patient presents with   Abdominal Pain    Randall Williams is a 89 y.o. male.  Patient with past history significant for hypothyroidism, CKD, dyslipidemia, iron deficiency anemia presents to the emergency department today with concerns of abdominal discomfort.  He reports that yesterday afternoon around 4:30 PM, he began to experience worsening indigestion that kept him awake all night.  He reports the pain is over the epigastrium that he has been noticing denies any nausea, vomiting, diarrhea, chest pain, shortness of breath.  Denies any difficulty with bowel movements recently.  Last bowel movement was yesterday.  Patient accompanied at bedside with his son-in-law.   Abdominal Pain      Home Medications Prior to Admission medications   Medication Sig Start Date End Date Taking? Authorizing Provider  desonide  (DESOWEN ) 0.05 % cream APPLY TO THE AFFECTED AREA(S) 2 TIMES A DAY 09/11/23   Rodney Clamp, MD  Erythromycin  2 % PADS Apply to affected area twice daily. 07/12/22   Rodney Clamp, MD  hydrOXYzine  (ATARAX ) 10 MG tablet Take 1 tablet (10 mg total) by mouth 3 (three) times daily as needed. 06/17/23   Rodney Clamp, MD  levothyroxine  (SYNTHROID ) 25 MCG tablet Take 1 tablet (25 mcg total) by mouth daily. 07/17/23   Rodney Clamp, MD  Multiple Vitamins-Minerals (CENTRUM SILVER 50+MEN) TABS Take 1 tablet by mouth daily in the afternoon.    [provider]  tamsulosin  (FLOMAX ) 0.4 MG CAPS capsule TAKE ONE CAPSULE BY MOUTH EVERY EVENING AFTER DINNER 05/12/23   Rodney Clamp, MD  vitamin C (ASCORBIC ACID) 500 MG tablet Take 500 mg by mouth daily.    [provider]      Allergies    Synthroid  [levothyroxine  sodium]    Review of Systems   Review of Systems  Gastrointestinal:  Positive for abdominal pain.   All other systems reviewed and are negative.   Physical Exam Updated Vital Signs BP (!) 151/78 (BP Location: Right Arm)   Pulse 80   Temp (!) 97.5 F (36.4 C) (Oral)   Resp 15   SpO2 96%  Physical Exam Vitals and nursing note reviewed.  Constitutional:      General: He is not in acute distress.    Appearance: He is well-developed. He is not ill-appearing, toxic-appearing or diaphoretic.  HENT:     Head: Normocephalic and atraumatic.  Eyes:     Conjunctiva/sclera: Conjunctivae normal.  Cardiovascular:     Rate and Rhythm: Normal rate and regular rhythm.     Heart sounds: Murmur heard.  Pulmonary:     Effort: Pulmonary effort is normal. No respiratory distress.     Breath sounds: Normal breath sounds.  Abdominal:     General: Bowel sounds are decreased.     Palpations: Abdomen is soft.     Tenderness: There is abdominal tenderness in the epigastric area. There is no guarding.  Musculoskeletal:        General: No swelling.     Cervical back: Neck supple.  Skin:    General: Skin is warm and dry.     Capillary Refill: Capillary refill takes less than 2 seconds.  Neurological:     Mental Status: He is alert.  Psychiatric:        Mood and Affect: Mood normal.  ED Results / Procedures / Treatments   Labs (all labs ordered are listed, but only abnormal results are displayed) Labs Reviewed  COMPREHENSIVE METABOLIC PANEL WITH GFR - Abnormal; Notable for the following components:      Result Value   Sodium 132 (*)    Potassium 5.8 (*)    CO2 20 (*)    Glucose, Bld 172 (*)    BUN 55 (*)    Creatinine, Ser 3.03 (*)    Albumin 2.9 (*)    GFR, Estimated 19 (*)    All other components within normal limits  CBC - Abnormal; Notable for the following components:   RBC 3.08 (*)    Hemoglobin 9.1 (*)    HCT 28.9 (*)    All other components within normal limits  URINALYSIS, ROUTINE W REFLEX MICROSCOPIC - Abnormal; Notable for the following components:   Glucose, UA 150  (*)    Protein, ur >=300 (*)    All other components within normal limits  LIPASE, BLOOD    EKG EKG Interpretation Date/Time:  Monday November 24 2023 06:49:49 EDT Ventricular Rate:  77 PR Interval:  196 QRS Duration:  84 QT Interval:  389 QTC Calculation: 441 R Axis:   25  Text Interpretation: Sinus rhythm Ventricular premature complex Borderline low voltage, extremity leads No significant change since last tracing Confirmed by Trish Furl [phone]) on 11/24/2023 8:21:00 AM  Radiology CT ABDOMEN PELVIS WO CONTRAST Result Date: 11/24/2023 CLINICAL DATA:  Abdominal pain.  Constipation. EXAM: CT ABDOMEN AND PELVIS WITHOUT CONTRAST TECHNIQUE: Multidetector CT imaging of the abdomen and pelvis was performed following the standard protocol without IV contrast. RADIATION DOSE REDUCTION: This exam was performed according to the departmental dose-optimization program which includes automated exposure control, adjustment of the mA and/or kV according to patient size and/or use of iterative reconstruction technique. COMPARISON:  03/06/2014 FINDINGS: Lower chest: Subsegmental atelectasis noted in the lung bases. Hepatobiliary: No suspicious focal abnormality in the liver on this study without intravenous contrast. Cholecystectomy. Common bile duct diameter is approximately 6-7 mm just proximal to the ampulla. Pancreas: No focal mass lesion. No dilatation of the main duct. No intraparenchymal cyst. No peripancreatic edema. Spleen: No splenomegaly. No suspicious focal mass lesion. Adrenals/Urinary Tract: No adrenal nodule or mass. Tiny hypodensity upper interpolar right kidney is not substantially changed since prior study compatible with benign etiology such as a cyst. No followup imaging is recommended. Left kidney unremarkable. No evidence for hydroureter. Bladder is mildly distended with subtle anterior irregular bladder wall thickening. Stomach/Bowel: Stomach is distended with food and fluid. Duodenum is normally  positioned as is the ligament of Treitz. Mild fluid-filled distention of jejunal loops. Mid small bowel in the abdomen and pelvis is fluid-filled and dilated up to 3.9 cm diameter scattered areas of interloop mesenteric fluid evident. There does appear to be a transition zone in the anterior midline abdomen just deep to the umbilicus (see image 50/2) with multiple apparent adhesions in the same region. There is some minimal fluid in the terminal ileum. The appendix is not well visualized, but there is no edema or inflammation in the region of the cecal tip to suggest appendicitis. No gross colonic mass. No colonic wall thickening. Diverticular changes are noted in the left colon without evidence of diverticulitis. Vascular/Lymphatic: There is moderate atherosclerotic calcification of the abdominal aorta without aneurysm. There is no gastrohepatic or hepatoduodenal ligament lymphadenopathy. No retroperitoneal or mesenteric lymphadenopathy. No pelvic sidewall lymphadenopathy. Reproductive: Prostate gland is enlarged and heterogeneous.  Other: Trace free fluid is seen adjacent to the liver with small volume free fluid seen in the pelvis and in the rectovesical pouch. Musculoskeletal: Small to moderate right groin hernia contains fat and fluid there is a thin band of soft tissue density that extends from the region of the anterior bladder wall into the right groin hernia (see axial 76/2 and coronal 50-51 of series 8). This is of unclear anatomic origin. No definite bowel extension into the hernia can be discerned. No worrisome lytic or sclerotic osseous abnormality. IMPRESSION: 1. Mid small bowel obstruction with apparent transition zone in the anterior midline abdomen just deep to the umbilicus. Multiple apparent adhesions in the same region. 2. Small volume free fluid in the abdomen and pelvis. 3. Small to moderate right groin hernia contains fat and fluid. There is a thin band of soft tissue density that extends from  the region of the anterior bladder wall into the right groin hernia. This is of unclear anatomic origin. No definite bowel extension into the right groin hernia can be discerned. 4. Left colonic diverticulosis without diverticulitis. 5. Enlarged, heterogeneous prostate gland. 6.  Aortic Atherosclerosis (ICD10-I70.0). Electronically Signed   By: Donnal Fusi M.D.   On: 11/24/2023 07:54    Procedures Procedures    Medications Ordered in ED Medications  iohexol  (OMNIPAQUE ) 300 MG/ML solution 100 mL (has no administration in time range)  sodium zirconium cyclosilicate (LOKELMA) packet 10 g (has no administration in time range)  0.9 %  sodium chloride infusion (has no administration in time range)  HYDROmorphone (DILAUDID) injection 1 mg (has no administration in time range)  ondansetron  (ZOFRAN ) injection 4 mg (has no administration in time range)  fentaNYL (SUBLIMAZE) injection 50 mcg (50 mcg Intravenous Given 11/24/23 0640)  sodium chloride 0.9 % bolus 1,000 mL (1,000 mLs Intravenous New Bag/Given 11/24/23 9147)    ED Course/ Medical Decision Making/ A&P                                 Medical Decision Making Amount and/or Complexity of Data Reviewed Labs: ordered. Radiology: ordered.  Risk Prescription drug management. Decision regarding hospitalization.   This patient presents to the ED for concern of abdominal pain.  Differential diagnosis includes obstruction, appendicitis, pancreatitis, ACS, bronchitis   Lab Tests:  I Ordered, and personally interpreted labs.  The pertinent results include: CBC shows fairly stable hemoglobin level at 9.1, CMP with mild hyponatremia 132 and hyperkalemia 5.8   Imaging Studies ordered:  I ordered imaging studies including CT abdomen pelvis I independently visualized and interpreted imaging which showed 1. Mid small bowel obstruction with apparent transition zone in the anterior midline abdomen just deep to the umbilicus. Multiple apparent  adhesions in the same region. 2. Small volume free fluid in the abdomen and pelvis. 3. Small to moderate right groin hernia contains fat and fluid. There is a thin band of soft tissue density that extends from the region of the anterior bladder wall into the right groin hernia. This is of unclear anatomic origin. No definite bowel extension into the right groin hernia can be discerned. 4. Left colonic diverticulosis without diverticulitis. 5. Enlarged, heterogeneous prostate gland. 6.  Aortic Atherosclerosis (ICD10-I70.0). I agree with the radiologist interpretation   Medicines ordered and prescription drug management:  I ordered medication including fluids, fentanyl for dehydration, pain Reevaluation of the patient after these medicines showed that the patient improved I have reviewed  the patients home medicines and have made adjustments as needed   Problem List / ED Course:  Patient with past history significant for hypothyroidism, CKD, dyslipidemia, iron deficiency anemia presents emergency department with concerns of abdominal discomfort.  He reports worsening abdominal pain starting yesterday evening after dinner around 4:30 PM.  States that he had feelings of indigestion are persistent and worsening.  Denies any nausea, vomiting, or diarrhea.  Last bowel movement yesterday.  History of prior bowel obstruction about 10 years ago. On exam, patient well-appearing with no obvious distress and no diaphoresis seen.  Tenderness palpation primarily towards the epigastrium although mild in nature.  He reports that pain is felt better after he had fentanyl administered.  Normal bowel sounds.  No significant heart or lung findings.  There is a notable heart murmur present which is likely chronic in nature. CT imaging was concerning for small bowel obstruction with apparent transition zone in the anterior midline abdomen deep to the umbilicus.  With this finding, will consult general surgery for possible  intervention.  Will be a hospital admission. Spoke with Zane Hews, general surgery PA, who advised NG tube placement and medicine admission with general surgery team evaluation.  No other recommendations at this time.  Consult to medicine placed. Spoke with Dr. Jannette Mend, hospitalist, who will be admitting patient.   Final Clinical Impression(s) / ED Diagnoses Final diagnoses:  Small bowel obstruction (HCC)  Hyperkalemia    Rx / DC Orders ED Discharge Orders     None         Concetta Dee, PA-C 11/24/23 [id number]    Trish Furl, MD 11/25/23 [phone]

## 2023-11-24 NOTE — ED Notes (Signed)
 Pt states he cannot void at this time for UA, but is aware and will callout when he is ready.

## 2023-11-24 NOTE — Plan of Care (Signed)
   Problem: Education: Goal: Knowledge of General Education information will improve Description: Including pain rating scale, medication(s)/side effects and non-pharmacologic comfort measures Outcome: Progressing   Problem: Activity: Goal: Risk for activity intolerance will decrease Outcome: Progressing

## 2023-11-24 NOTE — Consult Note (Signed)
 Consult Note  Randall Williams Jan 20, 1933  [id number].    Requesting MD: Pincus Bridgeman, MD Chief Complaint/Reason for Consult: SBO HPI:  Patient is an 89 year old male who presented to the ED this morning with abdominal pain that started yesterday afternoon. He noted initially more indigestion type symptoms and belching and then pain gradually came on and worsened through the evening and night. He did have a BM yesterday around 12 PM and did not note anything remarkable like blood or diarrhea. Denied nausea or vomiting. He had a prior SBO in 2014 that resolved without surgical intervention. Prior abdominal surgery includes laparoscopic cholecystectomy in 2005. PMH otherwise significant for hypothyroidism, CKD stage III, anemia of chronic kidney disease, BPH, hx of lymphoma and macular degeneration. He is not on blood thinners. Allergic to synthroid .   ROS: ROS Negative other than HPI  History reviewed. No pertinent family history.  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 His smoking use included cigarettes. He has never used smokeless tobacco. He reports that he does not drink alcohol and does not use drugs.  Allergies:  Allergies  Allergen Reactions   Synthroid  [Levothyroxine  Sodium] Hives and Rash    Medications Prior to Admission  Medication Sig Dispense Refill   Multiple Vitamins-Minerals (CENTRUM SILVER 50+MEN) TABS Take 1 tablet by mouth daily in the afternoon.     tamsulosin  (FLOMAX ) 0.4 MG CAPS capsule TAKE ONE CAPSULE BY MOUTH EVERY EVENING AFTER DINNER 90 capsule 3   vitamin C (ASCORBIC ACID) 500 MG tablet Take 500 mg by mouth daily.     levothyroxine  (SYNTHROID ) 25 MCG tablet Take 1 tablet  (25 mcg total) by mouth daily. (Patient not taking: Reported on 11/24/2023) 90 tablet 3    Blood pressure (!) 152/70, pulse 79, temperature 97.6 F (36.4 C), temperature source Oral, resp. rate 16, SpO2 99%. Physical Exam:  General: pleasant, WD, WN male who is laying in bed in NAD and looks remarkable for his age HEENT: head is normocephalic, atraumatic.  Sclera are noninjected.  PERRL.  Ears and nose without any masses or lesions.  Mouth is pink and dry Heart: regular, rate, and rhythm.  Normal s1,s2. No obvious murmurs, gallops, or rubs noted.   Lungs: CTAB, no wheezes, rhonchi, or rales noted.  Respiratory effort nonlabored Abd: soft, NT, mild distention, +BS, no masses or organomegaly.  Small reducible RIH.  NGT in place with minimal output right now, but he said they got a lot out when they placed it originally in the ED Psych: A&Ox3 with an appropriate affect.   Results for orders placed or performed during the hospital encounter of 11/24/23 (from the past 48 hours)  Lipase, blood     Status: None   Collection Time: 11/24/23  6:31 AM  Result Value Ref Range   Lipase 47 11 - 51 U/L    Comment: Performed at [facility], [address] [place], [place] [address]  Comprehensive metabolic panel     Status: Abnormal   Collection Time: 11/24/23  6:31 AM  Result Value Ref Range   Sodium 132 (L) 135 - 145 mmol/L   Potassium 5.8 (H) 3.5 - 5.1 mmol/L  Comment: HEMOLYSIS AT THIS LEVEL MAY AFFECT RESULT   Chloride 104 98 - 111 mmol/L   CO2 20 (L) 22 - 32 mmol/L   Glucose, Bld 172 (H) 70 - 99 mg/dL    Comment: Glucose reference range applies only to samples taken after fasting for at least 8 hours.   BUN 55 (H) 8 - 23 mg/dL   Creatinine, Ser 1.61 (H) 0.61 - 1.24 mg/dL   Calcium  8.9 8.9 - 10.3 mg/dL   Total Protein 6.9 6.5 - 8.1 g/dL   Albumin 2.9 (L) 3.5 - 5.0 g/dL   AST 32 15 - 41 U/L    Comment: HEMOLYSIS AT THIS LEVEL MAY AFFECT RESULT   ALT 16 0 - 44 U/L     Comment: HEMOLYSIS AT THIS LEVEL MAY AFFECT RESULT   Alkaline Phosphatase 77 38 - 126 U/L   Total Bilirubin 1.1 0.0 - 1.2 mg/dL    Comment: HEMOLYSIS AT THIS LEVEL MAY AFFECT RESULT   GFR, Estimated 19 (L) >60 mL/min    Comment: (NOTE) Calculated using the CKD-EPI Creatinine Equation (2021)    Anion gap 8 5 - 15    Comment: Performed at [facility], [address], [place], [place] [address]  CBC     Status: Abnormal   Collection Time: 11/24/23  6:31 AM  Result Value Ref Range   WBC 8.8 4.0 - 10.5 K/uL   RBC 3.08 (L) 4.22 - 5.81 MIL/uL   Hemoglobin 9.1 (L) 13.0 - 17.0 g/dL   HCT 54.0 (L) 98.1 - 19.1 %   MCV 93.8 80.0 - 100.0 fL   MCH 29.5 26.0 - 34.0 pg   MCHC 31.5 30.0 - 36.0 g/dL   RDW 47.8 29.5 - 62.1 %   Platelets 318 150 - 400 K/uL   nRBC 0.0 0.0 - 0.2 %    Comment: Performed at [facility], [address] [place], [place] [address]  Urinalysis, Routine w reflex microscopic -Urine, Clean Catch     Status: Abnormal   Collection Time: 11/24/23  8:24 AM  Result Value Ref Range   Color, Urine YELLOW YELLOW   APPearance CLEAR CLEAR   Specific Gravity, Urine 1.014 1.005 - 1.030   pH 6.0 5.0 - 8.0   Glucose, UA 150 (A) NEGATIVE mg/dL   Hgb urine dipstick NEGATIVE NEGATIVE   Bilirubin Urine NEGATIVE NEGATIVE   Ketones, ur NEGATIVE NEGATIVE mg/dL   Protein, ur >=784 (A) NEGATIVE mg/dL   Nitrite NEGATIVE NEGATIVE   Leukocytes,Ua NEGATIVE NEGATIVE   RBC / HPF 0-5 0 - 5 RBC/hpf   WBC, UA 0-5 0 - 5 WBC/hpf   Bacteria, UA NONE SEEN NONE SEEN   Squamous Epithelial / HPF 0-5 0 - 5 /HPF   Mucus PRESENT    Hyaline Casts, UA PRESENT     Comment: Performed at [facility], [address], [place], [place] [address]   DG Abd Portable 1V Result Date: 11/24/2023 CLINICAL DATA:  [id number] Encounter for imaging study to confirm nasogastric (NG) tube placement 244010. EXAM: PORTABLE ABDOMEN - 1 VIEW COMPARISON:  03/08/2014. FINDINGS:  There is gaseous dilatation of small bowel loops, which is disproportionate to the degree of distention of the colon, suggesting small bowel obstruction. No evidence of pneumoperitoneum, within the limitations of a supine film. No acute osseous abnormalities. The soft tissues are within normal limits. Surgical changes, devices, tubes and lines: Enteric tube is seen with its tip overlying the left upper quadrant,  overlying the proximal stomach region and side hole at the level of left hemidiaphragm overlying the GE junction region. Recommend further advancement of the tube by 2-4 inches to confidently put the side hole into the stomach. IMPRESSION: 1. Enteric tube is seen with its tip overlying the left upper quadrant, overlying the proximal stomach region and side hole at the level of left hemidiaphragm overlying the GE junction region. Recommend further advancement of the tube by 2-4 inches to confidently put the side hole into the stomach. 2. Findings suggestive of small bowel obstruction. Electronically Signed   By: Beula Brunswick M.D.   On: 11/24/2023 10:18   CT ABDOMEN PELVIS WO CONTRAST Result Date: 11/24/2023 CLINICAL DATA:  Abdominal pain.  Constipation. EXAM: CT ABDOMEN AND PELVIS WITHOUT CONTRAST TECHNIQUE: Multidetector CT imaging of the abdomen and pelvis was performed following the standard protocol without IV contrast. RADIATION DOSE REDUCTION: This exam was performed according to the departmental dose-optimization program which includes automated exposure control, adjustment of the mA and/or kV according to patient size and/or use of iterative reconstruction technique. COMPARISON:  03/06/2014 FINDINGS: Lower chest: Subsegmental atelectasis noted in the lung bases. Hepatobiliary: No suspicious focal abnormality in the liver on this study without intravenous contrast. Cholecystectomy. Common bile duct diameter is approximately 6-7 mm just proximal to the ampulla. Pancreas: No focal mass lesion. No  dilatation of the main duct. No intraparenchymal cyst. No peripancreatic edema. Spleen: No splenomegaly. No suspicious focal mass lesion. Adrenals/Urinary Tract: No adrenal nodule or mass. Tiny hypodensity upper interpolar right kidney is not substantially changed since prior study compatible with benign etiology such as a cyst. No followup imaging is recommended. Left kidney unremarkable. No evidence for hydroureter. Bladder is mildly distended with subtle anterior irregular bladder wall thickening. Stomach/Bowel: Stomach is distended with food and fluid. Duodenum is normally positioned as is the ligament of Treitz. Mild fluid-filled distention of jejunal loops. Mid small bowel in the abdomen and pelvis is fluid-filled and dilated up to 3.9 cm diameter scattered areas of interloop mesenteric fluid evident. There does appear to be a transition zone in the anterior midline abdomen just deep to the umbilicus (see image 50/2) with multiple apparent adhesions in the same region. There is some minimal fluid in the terminal ileum. The appendix is not well visualized, but there is no edema or inflammation in the region of the cecal tip to suggest appendicitis. No gross colonic mass. No colonic wall thickening. Diverticular changes are noted in the left colon without evidence of diverticulitis. Vascular/Lymphatic: There is moderate atherosclerotic calcification of the abdominal aorta without aneurysm. There is no gastrohepatic or hepatoduodenal ligament lymphadenopathy. No retroperitoneal or mesenteric lymphadenopathy. No pelvic sidewall lymphadenopathy. Reproductive: Prostate gland is enlarged and heterogeneous. Other: Trace free fluid is seen adjacent to the liver with small volume free fluid seen in the pelvis and in the rectovesical pouch. Musculoskeletal: Small to moderate right groin hernia contains fat and fluid there is a thin band of soft tissue density that extends from the region of the anterior bladder wall into  the right groin hernia (see axial 76/2 and coronal 50-51 of series 8). This is of unclear anatomic origin. No definite bowel extension into the hernia can be discerned. No worrisome lytic or sclerotic osseous abnormality. IMPRESSION: 1. Mid small bowel obstruction with apparent transition zone in the anterior midline abdomen just deep to the umbilicus. Multiple apparent adhesions in the same region. 2. Small volume free fluid in the abdomen and pelvis. 3. Small to  moderate right groin hernia contains fat and fluid. There is a thin band of soft tissue density that extends from the region of the anterior bladder wall into the right groin hernia. This is of unclear anatomic origin. No definite bowel extension into the right groin hernia can be discerned. 4. Left colonic diverticulosis without diverticulitis. 5. Enlarged, heterogeneous prostate gland. 6.  Aortic Atherosclerosis (ICD10-I70.0). Electronically Signed   By: Donnal Fusi M.D.   On: 11/24/2023 07:54      Assessment/Plan SBO - CT today with mid SBO with apparent transition in the anterior midline abdomen just deep to umbilicus, small volume free fluid in abdomen and pelvis, small to moderate RIH containing fat and fluid but no definite bowel, and this is reducible on exam.  Do not think this is the etiology. - NGT placed and KUB showed port at GE junction - recommend advancement 5-10 cm if not already done  - recommend SBO protocol  - would not recommend emergent surgical intervention at this time but we will follow   FEN: NPO, IVF per [facility], NGT to LIWS VTE: ok to have LMWH or SQH from surgery standpoint ID: no current abx  - per [facility] -  Hypothyroidism Hx of lymphoma  Anemia of chronic disease CKD stage III BPH Macular degeneration   I reviewed ED provider notes, hospitalist notes, last 24 h vitals and pain scores, last 48 h intake and output, last 24 h labs and trends, and last 24 h imaging results.   Randall Williams, [facility] Surgery 11/24/2023, 11:05 AM Please see Amion for pager number during day hours 7:00am-4:30pm

## 2023-11-24 NOTE — ED Triage Notes (Addendum)
 Patient is coming from home via EMS. EMS reports abdominal pain. In 2014 patient had an incarcerated bowl and this feels similar, but pain is not as severe. Also reports belching and constipation. Denies nausea, blood stool, diarrhea. Denies fel short of breath, no chest pain. Patient is hard of hearing.  LBM was 11/23/2023.

## 2023-11-24 NOTE — H&P (Signed)
 History and Physical  [name] JYN:[id number] DOB: 1933-02-03 DOA: 11/24/2023  PCP: Rodney Clamp, MD   Chief Complaint: Abdominal pain  HPI: Randall Williams is a 89 y.o. male with medical history significant for hypothyroidism, prior SBO, CKD and associated chronic anemia being admitted to the hospital with recurrent small bowel obstruction.  History is provided by the patient, as well as his stepson who is at the bedside.  He was in his usual state of health until yesterday afternoon, when he started having indigestion symptoms like heartburn and burping.  He had a normal bowel movement yesterday about 5:30 PM.  Through the evening, his abdominal discomfort worsened to the point that he was having pain.  He did not have any nausea or vomiting.  Denies any fevers, chills, chest pain.  Due to the worsening abdominal pain he came to the ER, where workup as detailed below shows evidence of bowel obstruction.  Review of Systems: Please see HPI for pertinent positives and negatives. A complete 10 system review of systems are otherwise negative.  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 His smoking use included cigarettes. He has never used smokeless tobacco. He reports that he does not drink alcohol and does not use drugs.  Allergies  Allergen Reactions   Synthroid  [Levothyroxine  Sodium] Hives and Rash    History reviewed. No pertinent family history.   Prior to Admission medications   Medication Sig Start Date End Date Taking? Authorizing Provider  levothyroxine  (SYNTHROID ) 25 MCG tablet Take 1 tablet (25 mcg total) by mouth daily. Patient not taking: Reported on 11/24/2023 07/17/23   Rodney Clamp, MD  Multiple  Vitamins-Minerals (CENTRUM SILVER 50+MEN) TABS Take 1 tablet by mouth daily in the afternoon.   Yes [provider]  tamsulosin  (FLOMAX ) 0.4 MG CAPS capsule TAKE ONE CAPSULE BY MOUTH EVERY EVENING AFTER DINNER 05/12/23  Yes Rodney Clamp, MD  vitamin C (ASCORBIC ACID) 500 MG tablet Take 500 mg by mouth daily.   Yes [provider]    Physical Exam: BP (!) 151/78 (BP Location: Right Arm)   Pulse 80   Temp (!) 97.5 F (36.4 C) (Oral)   Resp 15   SpO2 96%  General:  Alert, oriented, calm, in no acute distress, NG tube in place, stepson at the bedside Cardiovascular: RRR, no murmurs or rubs, no peripheral edema  Respiratory: clear to auscultation bilaterally, no wheezes, no crackles  Abdomen: soft, nontender, nondistended Skin: dry, no rashes  Musculoskeletal: no joint effusions, normal range of motion  Psychiatric: appropriate affect, normal speech  Neurologic: extraocular muscles intact, clear speech, moving all extremities with intact sensorium         Labs on Admission:  Basic Metabolic Panel: Recent Labs  Lab 11/24/23 0631  NA 132*  K 5.8*  CL 104  CO2 20*  GLUCOSE 172*  BUN 55*  CREATININE 3.03*  CALCIUM  8.9   Liver Function Tests: Recent Labs  Lab 11/24/23 0631  AST 32  ALT 16  ALKPHOS 77  BILITOT 1.1  PROT 6.9  ALBUMIN 2.9*   Recent Labs  Lab 11/24/23 0631  LIPASE 47   No results for input(s): "AMMONIA" in the last 168 hours. CBC:  Recent Labs  Lab 11/24/23 0631  WBC 8.8  HGB 9.1*  HCT 28.9*  MCV 93.8  PLT 318   Cardiac Enzymes: No results for input(s): "CKTOTAL", "CKMB", "CKMBINDEX", "TROPONINI" in the last 168 hours. BNP (last 3 results) No results for input(s): "BNP" in the last 8760 hours.  ProBNP (last 3 results) No results for input(s): "PROBNP" in the last 8760 hours.  CBG: No results for input(s): "GLUCAP" in the last 168 hours.  Radiological Exams on Admission: CT ABDOMEN PELVIS WO CONTRAST Result Date:  11/24/2023 CLINICAL DATA:  Abdominal pain.  Constipation. EXAM: CT ABDOMEN AND PELVIS WITHOUT CONTRAST TECHNIQUE: Multidetector CT imaging of the abdomen and pelvis was performed following the standard protocol without IV contrast. RADIATION DOSE REDUCTION: This exam was performed according to the departmental dose-optimization program which includes automated exposure control, adjustment of the mA and/or kV according to patient size and/or use of iterative reconstruction technique. COMPARISON:  03/06/2014 FINDINGS: Lower chest: Subsegmental atelectasis noted in the lung bases. Hepatobiliary: No suspicious focal abnormality in the liver on this study without intravenous contrast. Cholecystectomy. Common bile duct diameter is approximately 6-7 mm just proximal to the ampulla. Pancreas: No focal mass lesion. No dilatation of the main duct. No intraparenchymal cyst. No peripancreatic edema. Spleen: No splenomegaly. No suspicious focal mass lesion. Adrenals/Urinary Tract: No adrenal nodule or mass. Tiny hypodensity upper interpolar right kidney is not substantially changed since prior study compatible with benign etiology such as a cyst. No followup imaging is recommended. Left kidney unremarkable. No evidence for hydroureter. Bladder is mildly distended with subtle anterior irregular bladder wall thickening. Stomach/Bowel: Stomach is distended with food and fluid. Duodenum is normally positioned as is the ligament of Treitz. Mild fluid-filled distention of jejunal loops. Mid small bowel in the abdomen and pelvis is fluid-filled and dilated up to 3.9 cm diameter scattered areas of interloop mesenteric fluid evident. There does appear to be a transition zone in the anterior midline abdomen just deep to the umbilicus (see image 50/2) with multiple apparent adhesions in the same region. There is some minimal fluid in the terminal ileum. The appendix is not well visualized, but there is no edema or inflammation in the region  of the cecal tip to suggest appendicitis. No gross colonic mass. No colonic wall thickening. Diverticular changes are noted in the left colon without evidence of diverticulitis. Vascular/Lymphatic: There is moderate atherosclerotic calcification of the abdominal aorta without aneurysm. There is no gastrohepatic or hepatoduodenal ligament lymphadenopathy. No retroperitoneal or mesenteric lymphadenopathy. No pelvic sidewall lymphadenopathy. Reproductive: Prostate gland is enlarged and heterogeneous. Other: Trace free fluid is seen adjacent to the liver with small volume free fluid seen in the pelvis and in the rectovesical pouch. Musculoskeletal: Small to moderate right groin hernia contains fat and fluid there is a thin band of soft tissue density that extends from the region of the anterior bladder wall into the right groin hernia (see axial 76/2 and coronal 50-51 of series 8). This is of unclear anatomic origin. No definite bowel extension into the hernia can be discerned. No worrisome lytic or sclerotic osseous abnormality. IMPRESSION: 1. Mid small bowel obstruction with apparent transition zone in the anterior midline abdomen just deep to the umbilicus. Multiple apparent adhesions in the same region. 2. Small volume free fluid in the abdomen and pelvis. 3. Small to moderate right groin hernia contains fat and fluid. There is a thin band of soft tissue density that extends from the region of the anterior bladder wall  into the right groin hernia. This is of unclear anatomic origin. No definite bowel extension into the right groin hernia can be discerned. 4. Left colonic diverticulosis without diverticulitis. 5. Enlarged, heterogeneous prostate gland. 6.  Aortic Atherosclerosis (ICD10-I70.0). Electronically Signed   By: Donnal Fusi M.D.   On: 11/24/2023 07:54   Assessment/Plan  Randall Williams is a 89 y.o. male with medical history significant for hypothyroidism, prior SBO, CKD and associated chronic anemia  being admitted to the hospital with recurrent small bowel obstruction.   SBO-with apparent transition zone in the anterior midline of the abdomen, CT scan mentioned apparent adhesions however patient denies having had abdominal surgery. -Inpatient admission -N.p.o., with NG tube -IV fluids -Pain and nausea medication as needed -General Surgery has been consulted  Hypothyroidism-not on medication  AKI on CKD-likely worsened due to fluid shifts from SBO -Hydrate as above -Monitor renal function  Chronic anemia-appears to be at baseline, presumably related to CKD  Hyperkalemia-without attendant EKG changes, given Lokelma -Recheck BMP this afternoon  DVT prophylaxis: Subcutaneous heparin    Code Status: Full Code  Consults called: General Surgery  Admission status: The appropriate patient status for this patient is INPATIENT. Inpatient status is judged to be reasonable and necessary in order to provide the required intensity of service to ensure the patient's safety. The patient's presenting symptoms, physical exam findings, and initial radiographic and laboratory data in the context of their chronic comorbidities is felt to place them at high risk for further clinical deterioration. Furthermore, it is not anticipated that the patient will be medically stable for discharge from the hospital within 2 midnights of admission.    I certify that at the point of admission it is my clinical judgment that the patient will require inpatient hospital care spanning beyond 2 midnights from the point of admission due to high intensity of service, high risk for further deterioration and high frequency of surveillance required  Time spent: 48 minutes  Randall Paolini Rickey Charm MD [facility] Pager [phone]  If 7PM-7AM, please contact night-coverage [email or web address] Password [email or web address]  11/24/2023, 10:09 AM

## 2023-11-25 ENCOUNTER — Inpatient Hospital Stay (HOSPITAL_COMMUNITY)

## 2023-11-25 DIAGNOSIS — K56609 Unspecified intestinal obstruction, unspecified as to partial versus complete obstruction: Secondary | ICD-10-CM | POA: Diagnosis not present

## 2023-11-25 MED ORDER — SODIUM CHLORIDE 0.9 % IV SOLN: INTRAVENOUS | Status: AC

## 2023-11-25 NOTE — Progress Notes (Signed)
 Subjective: Patient feels so much better.  Having 5 BMs and feels great.    ROS: See above, otherwise other systems negative  Objective: Vital signs in last 24 hours: Temp:  [97.6 F (36.4 C)-98.2 F (36.8 C)] 98 F (36.7 C) (06/10 0529) Pulse Rate:  [77-79] 78 (06/10 0529) Resp:  [16-18] 18 (06/10 0529) BP: (143-163)/(70-80) 143/75 (06/10 0529) SpO2:  [98 %-99 %] 98 % (06/10 0529) Last BM Date : 11/24/23  Intake/Output from previous day: 06/09 0701 - 06/10 0700 In: 3375 [I.V.:2375; IV Piggyback:1000] Out: 450 [Urine:200; Emesis/NG output:250] Intake/Output this shift: No intake/output data recorded.  PE: Abd: soft, NT, Nd, +BS, NGT with thin minimal output.  Lab Results:  Recent Labs    11/24/23 0631  WBC 8.8  HGB 9.1*  HCT 28.9*  PLT 318   BMET Recent Labs    11/24/23 0631 11/24/23 1242  NA 132* 134*  K 5.8* 5.3*  CL 104 109  CO2 20* 18*  GLUCOSE 172* 133*  BUN 55* 52*  CREATININE 3.03* 2.74*  CALCIUM  8.9 8.3*   PT/INR No results for input(s): "LABPROT", "INR" in the last 72 hours. CMP     Component Value Date/Time   NA 134 (L) 11/24/2023 1242   K 5.3 (H) 11/24/2023 1242   CL 109 11/24/2023 1242   CO2 18 (L) 11/24/2023 1242   GLUCOSE 133 (H) 11/24/2023 1242   BUN 52 (H) 11/24/2023 1242   CREATININE 2.74 (H) 11/24/2023 1242   CALCIUM  8.3 (L) 11/24/2023 1242   PROT 6.9 11/24/2023 0631   ALBUMIN 2.9 (L) 11/24/2023 0631   AST 32 11/24/2023 0631   ALT 16 11/24/2023 0631   ALKPHOS 77 11/24/2023 0631   BILITOT 1.1 11/24/2023 0631   GFRNONAA 21 (L) 11/24/2023 1242   GFRAA 63 (L) 03/07/2014 0840   Lipase     Component Value Date/Time   LIPASE 47 11/24/2023 0631       Studies/Results: DG Abd Portable 1V-Small Bowel Obstruction Protocol-initial, 8 hr delay Result Date: 11/25/2023 CLINICAL DATA:  Small-bowel obstruction 8 hour delay EXAM: PORTABLE ABDOMEN - 1 VIEW COMPARISON:  Abdominal radiograph 11/24/2023 at 10:11 a.m. FINDINGS:  Enteric tube tip in the stomach with side port near the gastroesophageal junction. Persistent dilation of the small bowel. Enteric contrast is present in the stomach. Enteric contrast is otherwise not well visualized. IMPRESSION: Persistent small bowel obstruction. Electronically Signed   By: Rozell Cornet M.D.   On: 11/25/2023 02:55   DG Abd Portable 1V Result Date: 11/24/2023 CLINICAL DATA:  [id number] Encounter for imaging study to confirm nasogastric (NG) tube placement [id number]. EXAM: PORTABLE ABDOMEN - 1 VIEW COMPARISON:  03/08/2014. FINDINGS: There is gaseous dilatation of small bowel loops, which is disproportionate to the degree of distention of the colon, suggesting small bowel obstruction. No evidence of pneumoperitoneum, within the limitations of a supine film. No acute osseous abnormalities. The soft tissues are within normal limits. Surgical changes, devices, tubes and lines: Enteric tube is seen with its tip overlying the left upper quadrant, overlying the proximal stomach region and side hole at the level of left hemidiaphragm overlying the GE junction region. Recommend further advancement of the tube by 2-4 inches to confidently put the side hole into the stomach. IMPRESSION: 1. Enteric tube is seen with its tip overlying the left upper quadrant, overlying the proximal stomach region and side hole at the level of left hemidiaphragm overlying the GE junction region. Recommend further advancement of  the tube by 2-4 inches to confidently put the side hole into the stomach. 2. Findings suggestive of small bowel obstruction. Electronically Signed   By: Beula Brunswick M.D.   On: 11/24/2023 10:18   CT ABDOMEN PELVIS WO CONTRAST Result Date: 11/24/2023 CLINICAL DATA:  Abdominal pain.  Constipation. EXAM: CT ABDOMEN AND PELVIS WITHOUT CONTRAST TECHNIQUE: Multidetector CT imaging of the abdomen and pelvis was performed following the standard protocol without IV contrast. RADIATION DOSE REDUCTION: This exam  was performed according to the departmental dose-optimization program which includes automated exposure control, adjustment of the mA and/or kV according to patient size and/or use of iterative reconstruction technique. COMPARISON:  03/06/2014 FINDINGS: Lower chest: Subsegmental atelectasis noted in the lung bases. Hepatobiliary: No suspicious focal abnormality in the liver on this study without intravenous contrast. Cholecystectomy. Common bile duct diameter is approximately 6-7 mm just proximal to the ampulla. Pancreas: No focal mass lesion. No dilatation of the main duct. No intraparenchymal cyst. No peripancreatic edema. Spleen: No splenomegaly. No suspicious focal mass lesion. Adrenals/Urinary Tract: No adrenal nodule or mass. Tiny hypodensity upper interpolar right kidney is not substantially changed since prior study compatible with benign etiology such as a cyst. No followup imaging is recommended. Left kidney unremarkable. No evidence for hydroureter. Bladder is mildly distended with subtle anterior irregular bladder wall thickening. Stomach/Bowel: Stomach is distended with food and fluid. Duodenum is normally positioned as is the ligament of Treitz. Mild fluid-filled distention of jejunal loops. Mid small bowel in the abdomen and pelvis is fluid-filled and dilated up to 3.9 cm diameter scattered areas of interloop mesenteric fluid evident. There does appear to be a transition zone in the anterior midline abdomen just deep to the umbilicus (see image 50/2) with multiple apparent adhesions in the same region. There is some minimal fluid in the terminal ileum. The appendix is not well visualized, but there is no edema or inflammation in the region of the cecal tip to suggest appendicitis. No gross colonic mass. No colonic wall thickening. Diverticular changes are noted in the left colon without evidence of diverticulitis. Vascular/Lymphatic: There is moderate atherosclerotic calcification of the abdominal  aorta without aneurysm. There is no gastrohepatic or hepatoduodenal ligament lymphadenopathy. No retroperitoneal or mesenteric lymphadenopathy. No pelvic sidewall lymphadenopathy. Reproductive: Prostate gland is enlarged and heterogeneous. Other: Trace free fluid is seen adjacent to the liver with small volume free fluid seen in the pelvis and in the rectovesical pouch. Musculoskeletal: Small to moderate right groin hernia contains fat and fluid there is a thin band of soft tissue density that extends from the region of the anterior bladder wall into the right groin hernia (see axial 76/2 and coronal 50-51 of series 8). This is of unclear anatomic origin. No definite bowel extension into the hernia can be discerned. No worrisome lytic or sclerotic osseous abnormality. IMPRESSION: 1. Mid small bowel obstruction with apparent transition zone in the anterior midline abdomen just deep to the umbilicus. Multiple apparent adhesions in the same region. 2. Small volume free fluid in the abdomen and pelvis. 3. Small to moderate right groin hernia contains fat and fluid. There is a thin band of soft tissue density that extends from the region of the anterior bladder wall into the right groin hernia. This is of unclear anatomic origin. No definite bowel extension into the right groin hernia can be discerned. 4. Left colonic diverticulosis without diverticulitis. 5. Enlarged, heterogeneous prostate gland. 6.  Aortic Atherosclerosis (ICD10-I70.0). Electronically Signed   By: Donnal Fusi  M.D.   On: 11/24/2023 07:54    Anti-infectives: Anti-infectives (From admission, onward)    None        Assessment/Plan SBO -8-hr delay reviewed and stills hows SBO; however, patient has moved his bowels about 5 times and has complete resolution of abdominal pain and minimal NGT output. -will DC NGT and give CLD and treat clinically -we did discuss the chance of regression and having to replace his NGT, etc, but he is agreeable  given how well he feels to remove his tube and try CLD.   FEN - CLD VTE - heparin ID - none  I reviewed hospitalist notes, last 24 h vitals and pain scores, last 48 h intake and output, last 24 h labs and trends, and last 24 h imaging results.   LOS: 1 day    Leone Ralphs , [facility] 11/25/2023, 8:39 AM Please see Amion for pager number during day hours 7:00am-4:30pm or 7:00am -11:30am on weekends

## 2023-11-25 NOTE — Progress Notes (Signed)
 [facility] ROUNDING NOTE Randall Williams ZOX:[id number]  DOB: 05-Apr-1933  DOA: 11/24/2023  PCP: Rodney Clamp, MD  11/25/2023,7:36 AM  LOS: 1 day    Code Status: full   From:  home   Current Dispo: unclear   89 year old male Hypothyroidism CKD 3A Chronic anemia History hypothyroid macular degeneration Previous small bowel obstruction in 2015 Repeat to [facility] with abdominal pain  sodium 132 potassium 5.8 BUN/creatinine 55/3.0 WBC 8 globin 9 platelet 3318 UA negative CT  abdomen/pelvis-mid small bowel obstruction with prior transition zone anterior midline abdomen deep to umbilicus multiple adherent adhesions small bowel and fluid small to moderate right colon hernia with fat and fluid unclear band of soft tissue from anterior bladder   General Surgery consulted  Plan  SBO SBO physiology now resolved-NG tube removed having multiple stools GEN surgery starting him on clear liquid diet Graduated diet and watch clinically over the next 24 hours Cut back fluid 50 cc/h  Hypothyroid Resume Synthroid  25  BPH, underlying CKD 3 AKI on admission secondary to volume depletion--metabolic acidosis with hyperkalemia Creatinine as well as dehydration seem resolved Saline as above Resume Flomax  Expect with continued volume acidosis will resolve as well hyperkalemia Hold Lokelma at this time  DVT prophylaxis: Heparin  Status is: Inpatient Remains inpatient appropriate because: Requires further workup   Subjective: Awake pleasant alert several stools no fever no chills no vomiting tolerating some liquid no pain  Objective + exam Vitals:   11/24/23 0620 11/24/23 1046 11/24/23 2041 11/25/23 0529  BP:  (!) 152/70 (!) 163/80 (!) 143/75  Pulse:  79 77 78  Resp:  16 18 18   Temp: (!) 97.5 F (36.4 C) 97.6 F (36.4 C) 98.2 F (36.8 C) 98 F (36.7 C)  TempSrc: Oral Oral Oral Oral  SpO2:  99% 98% 98%   There were no vitals filed for this visit.  Examination: EOMI NCAT no focal deficit no  icterus no pallor Chest is clear no wheeze S1-S2 no murmur ROM intact Abdomen soft no rebound no guarding  Data Reviewed: reviewed   CBC    Component Value Date/Time   WBC 8.8 11/24/2023 0631   RBC 3.08 (L) 11/24/2023 0631   HGB 9.1 (L) 11/24/2023 0631   HCT 28.9 (L) 11/24/2023 0631   PLT 318 11/24/2023 0631   MCV 93.8 11/24/2023 0631   MCH 29.5 11/24/2023 0631   MCHC 31.5 11/24/2023 0631   RDW 14.6 11/24/2023 0631   LYMPHSABS 2.5 11/17/2017 1457   MONOABS 0.4 11/17/2017 1457   EOSABS 0.1 11/17/2017 1457   BASOSABS 0.0 11/17/2017 1457      Latest Ref Rng & Units 11/24/2023   12:42 PM 11/24/2023    6:31 AM 06/27/2023   10:24 AM  CMP  Glucose 70 - 99 mg/dL 811  914  90   BUN 8 - 23 mg/dL 52  55  45   Creatinine 0.61 - 1.24 mg/dL 7.82  9.56  2.13   Sodium 135 - 145 mmol/L 134  132  136   Potassium 3.5 - 5.1 mmol/L 5.3  5.8  5.3 No hemolysis seen   Chloride 98 - 111 mmol/L 109  104  111   CO2 22 - 32 mmol/L 18  20  22    Calcium  8.9 - 10.3 mg/dL 8.3  8.9  8.7   Total Protein 6.5 - 8.1 g/dL  6.9  6.4   Total Bilirubin 0.0 - 1.2 mg/dL  1.1  0.3   Alkaline Phos 38 -  126 U/L  77  72   AST 15 - 41 U/L  32  19   ALT 0 - 44 U/L  16  14     Scheduled Meds:  heparin  5,000 Units Subcutaneous Q8H   tamsulosin   0.4 mg Oral QPC supper   Continuous Infusions:  sodium chloride      Time 34  Jai-Gurmukh Airam Heidecker, MD  [facility]

## 2023-11-25 NOTE — Progress Notes (Signed)
 Mobility Specialist - Progress Note   11/25/23 0858  Mobility  Activity Ambulated independently in hallway  Level of Assistance Independent after set-up  Assistive Device Other (Comment) (IV Pole)  Distance Ambulated (ft) 500 ft  Activity Response Tolerated well  Mobility Referral Yes  Mobility visit 1 Mobility  Mobility Specialist Start Time (ACUTE ONLY) 0850  Mobility Specialist Stop Time (ACUTE ONLY) 0858  Mobility Specialist Time Calculation (min) (ACUTE ONLY) 8 min   Pt received in bed and agreeable to mobility. No complaints during session. Pt to bed after session with all needs met.   [employer]

## 2023-11-25 NOTE — Plan of Care (Signed)
   Problem: Education: Goal: Knowledge of General Education information will improve Description: Including pain rating scale, medication(s)/side effects and non-pharmacologic comfort measures Outcome: Progressing   Problem: Activity: Goal: Risk for activity intolerance will decrease Outcome: Progressing

## 2023-11-26 DIAGNOSIS — K56609 Unspecified intestinal obstruction, unspecified as to partial versus complete obstruction: Secondary | ICD-10-CM | POA: Diagnosis not present

## 2023-11-26 MED ORDER — ONDANSETRON HCL 4 MG PO TABS: 4.0000 mg | ORAL_TABLET | Freq: Three times a day (TID) | ORAL | 0 refills | Status: DC | PRN

## 2023-11-26 NOTE — Progress Notes (Signed)
 Subjective: Feels well.  Tolerating FLD with bowel function still.  ROS: See above, otherwise other systems negative  Objective: Vital signs in last 24 hours: Temp:  [97.9 F (36.6 C)-98.5 F (36.9 C)] 97.9 F (36.6 C) (06/11 0655) Pulse Rate:  [68-73] 68 (06/11 0655) Resp:  [16-17] 17 (06/11 0655) BP: (127-140)/(64-73) 127/64 (06/11 0655) SpO2:  [97 %-99 %] 99 % (06/11 0655) Last BM Date : 11/25/23  Intake/Output from previous day: 06/10 0701 - 06/11 0700 In: 1562.9 [P.O.:720; I.V.:842.9] Out: -  Intake/Output this shift: No intake/output data recorded.  PE: Abd: soft, NT, Nd, +BS  Lab Results:  Recent Labs    11/24/23 0631  WBC 8.8  HGB 9.1*  HCT 28.9*  PLT 318   BMET Recent Labs    11/24/23 0631 11/24/23 1242  NA 132* 134*  K 5.8* 5.3*  CL 104 109  CO2 20* 18*  GLUCOSE 172* 133*  BUN 55* 52*  CREATININE 3.03* 2.74*  CALCIUM  8.9 8.3*   PT/INR No results for input(s): LABPROT, INR in the last 72 hours. CMP     Component Value Date/Time   NA 134 (L) 11/24/2023 1242   K 5.3 (H) 11/24/2023 1242   CL 109 11/24/2023 1242   CO2 18 (L) 11/24/2023 1242   GLUCOSE 133 (H) 11/24/2023 1242   BUN 52 (H) 11/24/2023 1242   CREATININE 2.74 (H) 11/24/2023 1242   CALCIUM  8.3 (L) 11/24/2023 1242   PROT 6.9 11/24/2023 0631   ALBUMIN 2.9 (L) 11/24/2023 0631   AST 32 11/24/2023 0631   ALT 16 11/24/2023 0631   ALKPHOS 77 11/24/2023 0631   BILITOT 1.1 11/24/2023 0631   GFRNONAA 21 (L) 11/24/2023 1242   GFRAA 63 (L) 03/07/2014 0840   Lipase     Component Value Date/Time   LIPASE 47 11/24/2023 0631       Studies/Results: DG Abd Portable 1V-Small Bowel Obstruction Protocol-initial, 8 hr delay Result Date: 11/25/2023 CLINICAL DATA:  Small-bowel obstruction 8 hour delay EXAM: PORTABLE ABDOMEN - 1 VIEW COMPARISON:  Abdominal radiograph 11/24/2023 at 10:11 a.m. FINDINGS: Enteric tube tip in the stomach with side port near the gastroesophageal  junction. Persistent dilation of the small bowel. Enteric contrast is present in the stomach. Enteric contrast is otherwise not well visualized. IMPRESSION: Persistent small bowel obstruction. Electronically Signed   By: Rozell Cornet M.D.   On: 11/25/2023 02:55   DG Abd Portable 1V Result Date: 11/24/2023 CLINICAL DATA:  [id number] Encounter for imaging study to confirm nasogastric (NG) tube placement [id number]. EXAM: PORTABLE ABDOMEN - 1 VIEW COMPARISON:  03/08/2014. FINDINGS: There is gaseous dilatation of small bowel loops, which is disproportionate to the degree of distention of the colon, suggesting small bowel obstruction. No evidence of pneumoperitoneum, within the limitations of a supine film. No acute osseous abnormalities. The soft tissues are within normal limits. Surgical changes, devices, tubes and lines: Enteric tube is seen with its tip overlying the left upper quadrant, overlying the proximal stomach region and side hole at the level of left hemidiaphragm overlying the GE junction region. Recommend further advancement of the tube by 2-4 inches to confidently put the side hole into the stomach. IMPRESSION: 1. Enteric tube is seen with its tip overlying the left upper quadrant, overlying the proximal stomach region and side hole at the level of left hemidiaphragm overlying the GE junction region. Recommend further advancement of the tube by 2-4 inches to confidently put the side hole into the  stomach. 2. Findings suggestive of small bowel obstruction. Electronically Signed   By: Beula Brunswick M.D.   On: 11/24/2023 10:18    Anti-infectives: Anti-infectives (From admission, onward)    None        Assessment/Plan SBO -tolerating FLD -adv to soft diet, if tolerates ok to DC home -d/w primary service  FEN - soft VTE - heparin ID - none  I reviewed hospitalist notes, last 24 h vitals and pain scores, last 48 h intake and output, last 24 h labs and trends, and last 24 h imaging  results.   LOS: 2 days    Leone Ralphs , [facility] 11/26/2023, 7:40 AM Please see Amion for pager number during day hours 7:00am-4:30pm or 7:00am -11:30am on weekends

## 2023-11-26 NOTE — Progress Notes (Signed)
   11/26/23 1142  TOC Brief Assessment  Insurance and Status Reviewed  Patient has primary care physician Yes  Home environment has been reviewed home w/ family  Prior level of function: mod ind  Prior/Current Home Services No current home services  Social Drivers of Health Review SDOH reviewed no interventions necessary  Readmission risk has been reviewed Yes  Transition of care needs no transition of care needs at this time

## 2023-11-26 NOTE — Discharge Summary (Signed)
 Physician Discharge Summary  Randall Williams YQM:[id number] DOB: 27-Mar-1933 DOA: 11/24/2023  PCP: Rodney Clamp, MD  Admit date: 11/24/2023 Discharge date: 11/26/2023  Admitted From: Home Disposition: Home  Recommendations for Outpatient Follow-up:  Follow up with PCP in 1 week with repeat CBC/BMP Outpatient follow-up with general surgery recommended Follow up in ED if symptoms worsen or new appear   Home Health: No Equipment/Devices: None  Discharge Condition: Stable CODE STATUS: Full Diet recommendation: Heart healthy/soft diet as per general surgery  Brief/Interim Summary: 89 y.o. male with medical history significant for hypothyroidism, prior SBO, CKD and chronic anemia presented with abdominal pain and was found to have recurrent small bowel obstruction.  General surgery was consulted.  He was managed conservatively.  During the hospitalization, his condition has improved.  NG tube has been removed.  He is having bowel movements.  Diet is being advanced to soft diet per general surgery today.  General surgery cleared the patient for discharge home today if he tolerates soft diet.  Discharge Diagnoses:   Small bowel obstruction - General surgery was consulted.  He was managed conservatively.  During the hospitalization, his condition has improved.  NG tube has been removed.  He is having bowel movements.  Diet is being advanced to soft diet per general surgery today.  General surgery cleared the patient for discharge home today if he tolerates soft diet.  Hyponatremia - Mild.  Outpatient follow-up.  No laceration  Hyperkalemia - Questionable cause.  Improving.  No labs today.  Outpatient follow-up  BPH--continue tamsulosin   CKD stage IV - Creatinine stable.  No labs today.  Outpatient follow-up of BMP.  Hypothyroidism - Apparently does not take levothyroxine  at home.  Outpatient follow-up with PCP   Discharge Instructions   Allergies as of 11/26/2023       Reactions    Synthroid  [levothyroxine  Sodium] Hives, Rash        Medication List     STOP taking these medications    levothyroxine  25 MCG tablet Commonly known as: SYNTHROID        TAKE these medications    ascorbic acid 500 MG tablet Commonly known as: VITAMIN C Take 500 mg by mouth daily.   Centrum Silver 50+Men Tabs Take 1 tablet by mouth daily in the afternoon.   ondansetron  4 MG tablet Commonly known as: Zofran  Take 1 tablet (4 mg total) by mouth every 8 (eight) hours as needed for nausea or vomiting.   tamsulosin  0.4 MG Caps capsule Commonly known as: FLOMAX  TAKE ONE CAPSULE BY MOUTH EVERY EVENING AFTER DINNER        Follow-up Information     Rodney Clamp, MD. Schedule an appointment as soon as possible for a visit in 1 week(s).   Specialty: Family Medicine Contact information: [address] [place] [address] [phone]                Allergies  Allergen Reactions   Synthroid  [Levothyroxine  Sodium] Hives and Rash    Consultations: General surgery   Procedures/Studies: DG Abd Portable 1V-Small Bowel Obstruction Protocol-initial, 8 hr delay Result Date: 11/25/2023 CLINICAL DATA:  Small-bowel obstruction 8 hour delay EXAM: PORTABLE ABDOMEN - 1 VIEW COMPARISON:  Abdominal radiograph 11/24/2023 at 10:11 a.m. FINDINGS: Enteric tube tip in the stomach with side port near the gastroesophageal junction. Persistent dilation of the small bowel. Enteric contrast is present in the stomach. Enteric contrast is otherwise not well visualized. IMPRESSION: Persistent small bowel obstruction. Electronically Signed   By: Herminia Lope  Stutzman M.D.   On: 11/25/2023 02:55   DG Abd Portable 1V Result Date: 11/24/2023 CLINICAL DATA:  [id number] Encounter for imaging study to confirm nasogastric (NG) tube placement [id number]. EXAM: PORTABLE ABDOMEN - 1 VIEW COMPARISON:  03/08/2014. FINDINGS: There is gaseous dilatation of small bowel loops, which is disproportionate to the degree of  distention of the colon, suggesting small bowel obstruction. No evidence of pneumoperitoneum, within the limitations of a supine film. No acute osseous abnormalities. The soft tissues are within normal limits. Surgical changes, devices, tubes and lines: Enteric tube is seen with its tip overlying the left upper quadrant, overlying the proximal stomach region and side hole at the level of left hemidiaphragm overlying the GE junction region. Recommend further advancement of the tube by 2-4 inches to confidently put the side hole into the stomach. IMPRESSION: 1. Enteric tube is seen with its tip overlying the left upper quadrant, overlying the proximal stomach region and side hole at the level of left hemidiaphragm overlying the GE junction region. Recommend further advancement of the tube by 2-4 inches to confidently put the side hole into the stomach. 2. Findings suggestive of small bowel obstruction. Electronically Signed   By: Beula Brunswick M.D.   On: 11/24/2023 10:18   CT ABDOMEN PELVIS WO CONTRAST Result Date: 11/24/2023 CLINICAL DATA:  Abdominal pain.  Constipation. EXAM: CT ABDOMEN AND PELVIS WITHOUT CONTRAST TECHNIQUE: Multidetector CT imaging of the abdomen and pelvis was performed following the standard protocol without IV contrast. RADIATION DOSE REDUCTION: This exam was performed according to the departmental dose-optimization program which includes automated exposure control, adjustment of the mA and/or kV according to patient size and/or use of iterative reconstruction technique. COMPARISON:  03/06/2014 FINDINGS: Lower chest: Subsegmental atelectasis noted in the lung bases. Hepatobiliary: No suspicious focal abnormality in the liver on this study without intravenous contrast. Cholecystectomy. Common bile duct diameter is approximately 6-7 mm just proximal to the ampulla. Pancreas: No focal mass lesion. No dilatation of the main duct. No intraparenchymal cyst. No peripancreatic edema. Spleen: No  splenomegaly. No suspicious focal mass lesion. Adrenals/Urinary Tract: No adrenal nodule or mass. Tiny hypodensity upper interpolar right kidney is not substantially changed since prior study compatible with benign etiology such as a cyst. No followup imaging is recommended. Left kidney unremarkable. No evidence for hydroureter. Bladder is mildly distended with subtle anterior irregular bladder wall thickening. Stomach/Bowel: Stomach is distended with food and fluid. Duodenum is normally positioned as is the ligament of Treitz. Mild fluid-filled distention of jejunal loops. Mid small bowel in the abdomen and pelvis is fluid-filled and dilated up to 3.9 cm diameter scattered areas of interloop mesenteric fluid evident. There does appear to be a transition zone in the anterior midline abdomen just deep to the umbilicus (see image 50/2) with multiple apparent adhesions in the same region. There is some minimal fluid in the terminal ileum. The appendix is not well visualized, but there is no edema or inflammation in the region of the cecal tip to suggest appendicitis. No gross colonic mass. No colonic wall thickening. Diverticular changes are noted in the left colon without evidence of diverticulitis. Vascular/Lymphatic: There is moderate atherosclerotic calcification of the abdominal aorta without aneurysm. There is no gastrohepatic or hepatoduodenal ligament lymphadenopathy. No retroperitoneal or mesenteric lymphadenopathy. No pelvic sidewall lymphadenopathy. Reproductive: Prostate gland is enlarged and heterogeneous. Other: Trace free fluid is seen adjacent to the liver with small volume free fluid seen in the pelvis and in the rectovesical pouch. Musculoskeletal: Small to  moderate right groin hernia contains fat and fluid there is a thin band of soft tissue density that extends from the region of the anterior bladder wall into the right groin hernia (see axial 76/2 and coronal 50-51 of series 8). This is of unclear  anatomic origin. No definite bowel extension into the hernia can be discerned. No worrisome lytic or sclerotic osseous abnormality. IMPRESSION: 1. Mid small bowel obstruction with apparent transition zone in the anterior midline abdomen just deep to the umbilicus. Multiple apparent adhesions in the same region. 2. Small volume free fluid in the abdomen and pelvis. 3. Small to moderate right groin hernia contains fat and fluid. There is a thin band of soft tissue density that extends from the region of the anterior bladder wall into the right groin hernia. This is of unclear anatomic origin. No definite bowel extension into the right groin hernia can be discerned. 4. Left colonic diverticulosis without diverticulitis. 5. Enlarged, heterogeneous prostate gland. 6.  Aortic Atherosclerosis (ICD10-I70.0). Electronically Signed   By: Donnal Fusi M.D.   On: 11/24/2023 07:54      Subjective: Patient seen and examined at bedside.  Feels better, tolerating diet.  Wants to go home today.  No fever or vomiting reported  Discharge Exam: Vitals:   11/25/23 2119 11/26/23 0655  BP: 131/71 127/64  Pulse: 73 68  Resp: 17 17  Temp: 98.5 F (36.9 C) 97.9 F (36.6 C)  SpO2: 99% 99%    General: Pt is alert, awake, not in acute distress.  Elderly male lying in bed. Cardiovascular: rate controlled, S1/S2 + Respiratory: bilateral decreased breath sounds at bases Abdominal: Soft, NT, ND, bowel sounds + Extremities: no edema, no cyanosis    The results of significant diagnostics from this hospitalization (including imaging, microbiology, ancillary and laboratory) are listed below for reference.     Microbiology: No results found for this or any previous visit (from the past 240 hours).   Labs: BNP (last 3 results) No results for input(s): BNP in the last 8760 hours. Basic Metabolic Panel: Recent Labs  Lab 11/24/23 0631 11/24/23 1242  NA 132* 134*  K 5.8* 5.3*  CL 104 109  CO2 20* 18*  GLUCOSE  172* 133*  BUN 55* 52*  CREATININE 3.03* 2.74*  CALCIUM  8.9 8.3*   Liver Function Tests: Recent Labs  Lab 11/24/23 0631  AST 32  ALT 16  ALKPHOS 77  BILITOT 1.1  PROT 6.9  ALBUMIN 2.9*   Recent Labs  Lab 11/24/23 0631  LIPASE 47   No results for input(s): AMMONIA in the last 168 hours. CBC: Recent Labs  Lab 11/24/23 0631  WBC 8.8  HGB 9.1*  HCT 28.9*  MCV 93.8  PLT 318   Cardiac Enzymes: No results for input(s): CKTOTAL, CKMB, CKMBINDEX, TROPONINI in the last 168 hours. BNP: Invalid input(s): POCBNP CBG: No results for input(s): GLUCAP in the last 168 hours. D-Dimer No results for input(s): DDIMER in the last 72 hours. Hgb A1c No results for input(s): HGBA1C in the last 72 hours. Lipid Profile No results for input(s): CHOL, HDL, LDLCALC, TRIG, CHOLHDL, LDLDIRECT in the last 72 hours. Thyroid  function studies No results for input(s): TSH, T4TOTAL, T3FREE, THYROIDAB in the last 72 hours.  Invalid input(s): FREET3 Anemia work up No results for input(s): VITAMINB12, FOLATE, FERRITIN, TIBC, IRON, RETICCTPCT in the last 72 hours. Urinalysis    Component Value Date/Time   COLORURINE YELLOW 11/24/2023 0824   APPEARANCEUR CLEAR 11/24/2023 0824   LABSPEC  1.014 11/24/2023 0824   PHURINE 6.0 11/24/2023 0824   GLUCOSEU 150 (A) 11/24/2023 0824   HGBUR NEGATIVE 11/24/2023 0824   BILIRUBINUR NEGATIVE 11/24/2023 0824   BILIRUBINUR neg 05/28/2021 1142   KETONESUR NEGATIVE 11/24/2023 0824   PROTEINUR >=300 (A) 11/24/2023 0824   UROBILINOGEN 0.2 05/28/2021 1142   UROBILINOGEN 0.2 03/06/2014 1628   NITRITE NEGATIVE 11/24/2023 0824   LEUKOCYTESUR NEGATIVE 11/24/2023 0824   Sepsis Labs Recent Labs  Lab 11/24/23 0631  WBC 8.8   Microbiology No results found for this or any previous visit (from the past 240 hours).   Time coordinating discharge: 35 minutes  SIGNED:   Audria Leather, MD  [facility] 11/26/2023, 8:24 AM

## 2023-11-26 NOTE — Discharge Instructions (Signed)
 Low fiber diet x1 week, then may resume a normal diet

## 2023-11-26 NOTE — Progress Notes (Signed)
 Discharge instructions given to patient and all questions were answered.

## 2023-11-27 ENCOUNTER — Telehealth: Payer: Self-pay

## 2023-11-27 NOTE — Transitions of Care (Post Inpatient/ED Visit) (Signed)
   11/27/2023  Name: Randall Williams MRN: [id number] DOB: June 19, 1932  Today's TOC FU Call Status: Today's TOC FU Call Status:: Successful TOC FU Call Completed TOC FU Call Complete Date: 11/27/23 Patient's Name and Date of Birth confirmed.  Transition Care Management Follow-up Telephone Call Date of Discharge: 11/26/23 Discharge Facility: [facility]) Type of Discharge: Inpatient Admission Primary Inpatient Discharge Diagnosis:: intestional obstruction How have you been since you were released from the hospital?: Better Any questions or concerns?: No  Items Reviewed: Did you receive and understand the discharge instructions provided?: Yes Medications obtained,verified, and reconciled?: Yes (Medications Reviewed) Any new allergies since your discharge?: No Dietary orders reviewed?: Yes Do you have support at home?: Yes People in Home [RPT]: spouse, child(ren), adult  Medications Reviewed Today: Medications Reviewed Today     Reviewed by Darrall Ellison, LPN (Licensed Practical Nurse) on 11/27/23 at 1034  Med List Status: <None>   Medication Order Taking? Sig Documenting Provider Last Dose Status Informant  Multiple Vitamins-Minerals (CENTRUM SILVER 50+MEN) TABS [id number] Yes Take 1 tablet by mouth daily in the afternoon. [provider]  Active Self, Pharmacy Records  ondansetron  (ZOFRAN ) 4 MG tablet [id number] Yes Take 1 tablet (4 mg total) by mouth every 8 (eight) hours as needed for nausea or vomiting. Audria Leather, MD  Active   tamsulosin  (FLOMAX ) 0.4 MG CAPS capsule [id number] Yes TAKE ONE CAPSULE BY MOUTH EVERY EVENING AFTER Keren Peasant, MD  Active Self, Pharmacy Records  vitamin C (ASCORBIC ACID) 500 MG tablet [id number] Yes Take 500 mg by mouth daily. [provider]  Active Self, Pharmacy Records            Home Care and Equipment/Supplies: Were Home Health Services Ordered?: NA Any new equipment or medical supplies ordered?:  NA  Functional Questionnaire: Do you need assistance with bathing/showering or dressing?: No Do you need assistance with meal preparation?: No Do you need assistance with eating?: No Do you have difficulty maintaining continence: No Do you need assistance with getting out of bed/getting out of a chair/moving?: No Do you have difficulty managing or taking your medications?: No  Follow up appointments reviewed: PCP Follow-up appointment confirmed?: Yes Date of PCP follow-up appointment?: 12/05/23 Follow-up Provider: [facility] Follow-up appointment confirmed?: NA Do you need transportation to your follow-up appointment?: No Do you understand care options if your condition(s) worsen?: Yes-patient verbalized understanding    SIGNATURE Darrall Ellison, LPN [facility] Nurse Health Advisor Direct Dial [phone]

## 2023-12-05 ENCOUNTER — Ambulatory Visit: Admitting: Family Medicine

## 2023-12-05 ENCOUNTER — Encounter: Payer: Self-pay | Admitting: Family Medicine

## 2023-12-05 VITALS — BP 138/64 | HR 70 | Temp 97.3°F | Ht 62.0 in | Wt 135.0 lb

## 2023-12-05 DIAGNOSIS — R399 Unspecified symptoms and signs involving the genitourinary system: Secondary | ICD-10-CM

## 2023-12-05 DIAGNOSIS — N183 Chronic kidney disease, stage 3 unspecified: Secondary | ICD-10-CM

## 2023-12-05 DIAGNOSIS — L309 Dermatitis, unspecified: Secondary | ICD-10-CM

## 2023-12-05 DIAGNOSIS — Z8719 Personal history of other diseases of the digestive system: Secondary | ICD-10-CM

## 2023-12-05 DIAGNOSIS — E038 Other specified hypothyroidism: Secondary | ICD-10-CM

## 2023-12-05 DIAGNOSIS — K56609 Unspecified intestinal obstruction, unspecified as to partial versus complete obstruction: Secondary | ICD-10-CM

## 2023-12-05 MED ORDER — DESONIDE 0.05 % EX CREA: TOPICAL_CREAM | Freq: Two times a day (BID) | CUTANEOUS | 0 refills | Status: DC

## 2023-12-05 NOTE — Addendum Note (Signed)
 Addended by: Alverda Joe on: 12/05/2023 02:44 PM   Modules accepted: Orders

## 2023-12-05 NOTE — Assessment & Plan Note (Signed)
 Recheck TSH and T4.  He did not start the Synthroid  we prescribed several months ago.  Doubt that this contributed to his SBO though may be contributing to his dry skin and pruritus.

## 2023-12-05 NOTE — Patient Instructions (Signed)
 It was very nice to see you today!  I am glad that you are feeling better.  We will check blood work today.  Use the desonide  cream as needed for the itching.  Return if symptoms worsen or fail to improve.   Take care, Dr Daneil Dunker  PLEASE NOTE:  If you had any lab tests, please let us  know if you have not heard back within a few days. You may see your results on mychart before we have a chance to review them but we will give you a call once they are reviewed by us .   If we ordered any referrals today, please let us  know if you have not heard from their office within the next week.   If you had any urgent prescriptions sent in today, please check with the pharmacy within an hour of our visit to make sure the prescription was transmitted appropriately.   Please try these tips to maintain a healthy lifestyle:  Eat at least 3 REAL meals and 1-2 snacks per day.  Aim for no more than 5 hours between eating.  If you eat breakfast, please do so within one hour of getting up.   Each meal should contain half fruits/vegetables, one quarter protein, and one quarter carbs (no bigger than a computer mouse)  Cut down on sweet beverages. This includes juice, soda, and sweet tea.   Drink at least 1 glass of water with each meal and aim for at least 8 glasses per day  Exercise at least 150 minutes every week.

## 2023-12-05 NOTE — Assessment & Plan Note (Signed)
 No red flags.  Pruritus has improved the last few months.  Likely secondary to xerosis cutis though we are also checking labs and addressing his subclinical hypothyroidism as above.  Will send a prescription for desonide  to use as needed for itching and inflammation.

## 2023-12-05 NOTE — Assessment & Plan Note (Signed)
 Doing well with tamsulosin  0.4 mg daily.

## 2023-12-05 NOTE — Assessment & Plan Note (Signed)
 Check labs

## 2023-12-05 NOTE — Progress Notes (Signed)
 Chief Complaint:  Randall Williams is a 89 y.o. male who presents today for a TCM visit.  Assessment/Plan:  New/Acute Problems: SBO Symptoms have resolved.  He is back to normal diet.  We discussed warning signs for recurrence and reasons to seek emergent care.  No further workup indicated at this point.  Chronic Problems Addressed Today: Subclinical hypothyroidism Recheck TSH and T4.  He did not start the Synthroid  we prescribed several months ago.  Doubt that this contributed to his SBO though may be contributing to his dry skin and pruritus.  CKD (chronic kidney disease) stage 3, GFR 30-59 ml/min (HCC) Check labs.  Lower urinary tract symptoms (LUTS) Doing well with tamsulosin  0.4 mg daily.  Dermatitis No red flags.  Pruritus has improved the last few months.  Likely secondary to xerosis cutis though we are also checking labs and addressing his subclinical hypothyroidism as above.  Will send a prescription for desonide  to use as needed for itching and inflammation.    Subjective:  HPI:  Summary of Hospital admission: Reason for admission: Small bowel obstruction Date of admission: 11/24/2023 Date of discharge: 11/26/2023 Date of Interactive contact: 11/27/2023 Summary of Hospital course: Patient presented to the ED on 12/14/2023 with abdominal pain.  Imaging showed recurrent small bowel obstruction.  He was admitted and general surgery was consulted.  He was managed conservatively with bowel decompression with NG tube.  Symptoms improved rapidly during admission.  Diet was advanced and he did well with this.  He was discharged home in stable condition.  Interim history:  Since being home he has been doing well.  He has been able to eat and drink normally.  No issues with constipation or diarrhea.  No nausea or vomiting.  He has not had to use any doses of the Zofran .  He feels like he is back to normal.  Still having occasional itching on his legs.  We did start Synthroid  several  months ago for subclinical hypothyroidism.  He does not think that he ever started the Synthroid . He does feel like the itching has improved.  ROS: Per HPI, otherwise a complete review of systems was negative.   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 No pertinent family history.  Medications- Reconciled discharge and current medications in Epic.  Current Outpatient Medications  Medication Sig Dispense Refill   desonide  (DESOWEN ) 0.05 % cream Apply topically 2 (two) times daily. 30 g 0   Multiple Vitamins-Minerals (CENTRUM SILVER 50+MEN) TABS Take 1 tablet by mouth daily in the afternoon.     tamsulosin  (FLOMAX ) 0.4 MG CAPS capsule TAKE ONE CAPSULE BY MOUTH EVERY EVENING AFTER DINNER 90 capsule 3   vitamin C (ASCORBIC ACID) 500 MG tablet Take 500 mg by mouth daily.     No current facility-administered  medications for this visit.    Allergies-reviewed and updated Allergies  Allergen Reactions   Synthroid  [Levothyroxine  Sodium] Hives and Rash    Social History   Socioeconomic History   Marital status: Married    Spouse name: Not on file   Number of children: Not on file   Years of education: Not on file   Highest education level: Not on file  Occupational History   Not  on file  Tobacco Use   Smoking status: Former    Current packs/day: 0.00    Types: Cigarettes    Quit date: 06/17/1968    Years since quitting: 55.5   Smokeless tobacco: Never  Substance and Sexual Activity   Alcohol use: No   Drug use: No   Sexual activity: Not on file  Other Topics Concern   Not on file  Social History Narrative   Not on file   Social Drivers of Health   Financial Resource Strain: Not on file  Food Insecurity: No Food Insecurity (11/24/2023)   Hunger Vital Sign    Worried About Running Out of Food in the Last Year: Never true    Ran Out of Food in the Last Year: Never true  Transportation Needs: Patient Declined (11/24/2023)   PRAPARE - Administrator, Civil Service (Medical): Patient declined    Lack of Transportation (Non-Medical): Patient declined  Physical Activity: Not on file  Stress: Not on file  Social Connections: Patient Declined (11/24/2023)   Social Connection and Isolation Panel    Frequency of Communication with Friends and Family: Patient declined    Frequency of Social Gatherings with Friends and Family: Patient declined    Attends Religious Services: Patient declined    Database administrator or Organizations: Patient declined    Attends Engineer, structural: Patient declined    Marital Status: Patient declined        Objective:  Physical Exam: BP 138/64   Pulse 70   Temp (!) 97.3 F (36.3 C) (Temporal)   Ht 5' 2 (1.575 m)   Wt 135 lb (61.2 kg)   SpO2 99%   BMI 24.69 kg/m   Gen: NAD, resting comfortably CV: RRR with no murmurs appreciated Pulm: NWOB, CTAB with no crackles, wheezes, or rhonchi GI: Normal bowel sounds present. Soft, Nontender, Nondistended. MSK: No edema, cyanosis, or clubbing noted Skin: Warm, dry Neuro: Grossly normal, moves all extremities Psych: Normal affect and thought content   Time Spent: 45 minutes of total time was spent on the date of the encounter performing the following  actions: chart review prior to seeing the patient including recent hospitalization, obtaining history, performing a medically necessary exam, counseling on the treatment plan, placing orders, and documenting in our EHR.       Jinny Mounts. Daneil Dunker, MD 12/05/2023 1:46 PM

## 2023-12-08 ENCOUNTER — Other Ambulatory Visit (INDEPENDENT_AMBULATORY_CARE_PROVIDER_SITE_OTHER)

## 2023-12-08 DIAGNOSIS — E038 Other specified hypothyroidism: Secondary | ICD-10-CM

## 2023-12-08 LAB — CBC
HCT: 24.8 % — ABNORMAL LOW (ref 39.0–52.0)
Hemoglobin: 8.1 g/dL — ABNORMAL LOW (ref 13.0–17.0)
MCHC: 32.7 g/dL (ref 30.0–36.0)
MCV: 89.8 fl (ref 78.0–100.0)
Platelets: 283 10*3/uL (ref 150.0–400.0)
RBC: 2.76 Mil/uL — ABNORMAL LOW (ref 4.22–5.81)
RDW: 16.3 % — ABNORMAL HIGH (ref 11.5–15.5)
WBC: 7.7 10*3/uL (ref 4.0–10.5)

## 2023-12-08 LAB — COMPREHENSIVE METABOLIC PANEL WITH GFR
ALT: 11 U/L (ref 0–53)
AST: 13 U/L (ref 0–37)
Albumin: 2.8 g/dL — ABNORMAL LOW (ref 3.5–5.2)
Alkaline Phosphatase: 65 U/L (ref 39–117)
BUN: 57 mg/dL — ABNORMAL HIGH (ref 6–23)
CO2: 19 meq/L (ref 19–32)
Calcium: 8 mg/dL — ABNORMAL LOW (ref 8.4–10.5)
Chloride: 113 meq/L — ABNORMAL HIGH (ref 96–112)
Creatinine, Ser: 2.81 mg/dL — ABNORMAL HIGH (ref 0.40–1.50)
GFR: 19.36 mL/min — ABNORMAL LOW (ref >60.00)
Glucose, Bld: 92 mg/dL (ref 70–99)
Potassium: 4.7 meq/L (ref 3.5–5.1)
Sodium: 138 meq/L (ref 135–145)
Total Bilirubin: 0.4 mg/dL (ref 0.2–1.2)
Total Protein: 5.5 g/dL — ABNORMAL LOW (ref 6.0–8.3)

## 2023-12-08 LAB — T4, FREE: Free T4: 0.76 ng/dL (ref 0.60–1.60)

## 2023-12-08 LAB — TSH: TSH: 9.67 u[IU]/mL — ABNORMAL HIGH (ref 0.35–5.50)

## 2023-12-09 ENCOUNTER — Ambulatory Visit: Payer: Self-pay | Admitting: Family Medicine

## 2023-12-09 DIAGNOSIS — D509 Iron deficiency anemia, unspecified: Secondary | ICD-10-CM

## 2023-12-09 NOTE — Progress Notes (Signed)
 His thyroid  level is borderline but similar to previous values.  It is okay for him to stay off the thyroid  medication for now and we can recheck again in a few months.  His kidney function is a little worse.  Recommend he follow-up with nephrology soon though would like for him to come back in a week or so to recheck.  He should make sure that he is trying to get plenty of fluids as well.  He is also a little more anemic than the last time we checked as well.  We should recheck his CBC when he comes back for labs.

## 2023-12-25 ENCOUNTER — Telehealth: Payer: Self-pay | Admitting: Family Medicine

## 2023-12-25 NOTE — Telephone Encounter (Signed)
Patient's wife returned call re: Lab results. Requests to be called.

## 2023-12-26 NOTE — Telephone Encounter (Signed)
 See results note.

## 2024-01-07 ENCOUNTER — Telehealth: Payer: Self-pay | Admitting: *Deleted

## 2024-01-07 ENCOUNTER — Other Ambulatory Visit (INDEPENDENT_AMBULATORY_CARE_PROVIDER_SITE_OTHER)

## 2024-01-07 DIAGNOSIS — D509 Iron deficiency anemia, unspecified: Secondary | ICD-10-CM | POA: Diagnosis not present

## 2024-01-07 LAB — CBC
HCT: 23.6 % — CL (ref 39.0–52.0)
Hemoglobin: 7.7 g/dL — CL (ref 13.0–17.0)
MCHC: 32.9 g/dL (ref 30.0–36.0)
MCV: 89.7 fl (ref 78.0–100.0)
Platelets: 260 K/uL (ref 150.0–400.0)
RBC: 2.63 Mil/uL — ABNORMAL LOW (ref 4.22–5.81)
RDW: 15.9 % — ABNORMAL HIGH (ref 11.5–15.5)
WBC: 6.4 K/uL (ref 4.0–10.5)

## 2024-01-07 NOTE — Telephone Encounter (Signed)
 Niels from  [facility] [place] lab called to give critical result of Hemoglobin: 7.7 and Hematocrit: 23.6. Spoke with Dr. Kennyth concerning lab and he stated that it can be handled on tomorrow. Also informed Corean Comment and she stated to give Dr. Kennyth a call first. Phone message sent to Dr. Kennyth.

## 2024-01-08 ENCOUNTER — Other Ambulatory Visit: Payer: Self-pay | Admitting: *Deleted

## 2024-01-08 ENCOUNTER — Ambulatory Visit: Payer: Self-pay | Admitting: Family Medicine

## 2024-01-08 DIAGNOSIS — D509 Iron deficiency anemia, unspecified: Secondary | ICD-10-CM

## 2024-01-08 NOTE — Telephone Encounter (Signed)
 Left message to return call to our office at their convenience.

## 2024-01-08 NOTE — Progress Notes (Signed)
 He is slightly more anemic than last time we checked.  Please see phone note from yesterday.

## 2024-01-08 NOTE — Telephone Encounter (Signed)
Future lab order  

## 2024-01-09 ENCOUNTER — Other Ambulatory Visit: Payer: Self-pay | Admitting: *Deleted

## 2024-01-09 MED ORDER — IRON (FERROUS SULFATE) 325 (65 FE) MG PO TABS: 325.0000 mg | ORAL_TABLET | Freq: Every day | ORAL | 3 refills | Status: DC

## 2024-01-09 NOTE — Progress Notes (Signed)
 It is ok to send in prescription for iron though would like for them to come back for repeat labs soon.

## 2024-01-09 NOTE — Telephone Encounter (Signed)
 Date of birth verified by patient  Lab results given,Pt verbalized understanding  Lab appt schedule

## 2024-01-09 NOTE — Telephone Encounter (Signed)
 Copied from CRM #[id number]. Topic: General - Other >> Jan 09, 2024 10:00 AM Thersia BROCKS wrote: Reason for CRM: Patient called in regarding a missed call from [facility], would like a callback  See previews note  Elora RMA

## 2024-01-12 ENCOUNTER — Other Ambulatory Visit (INDEPENDENT_AMBULATORY_CARE_PROVIDER_SITE_OTHER)

## 2024-01-12 DIAGNOSIS — D509 Iron deficiency anemia, unspecified: Secondary | ICD-10-CM

## 2024-01-13 LAB — FOLATE: Folate: 15.3 ng/mL (ref >5.9)

## 2024-01-13 LAB — IRON,TIBC AND FERRITIN PANEL
%SAT: 20 % (ref 20–48)
Ferritin: 24 ng/mL (ref 24–380)
Iron: 55 ug/dL (ref 50–180)
TIBC: 274 ug/dL (ref 250–425)

## 2024-01-13 LAB — VITAMIN B12: Vitamin B-12: 380 pg/mL (ref 211–911)

## 2024-01-14 ENCOUNTER — Ambulatory Visit: Payer: Self-pay | Admitting: Family Medicine

## 2024-01-14 DIAGNOSIS — E611 Iron deficiency: Secondary | ICD-10-CM

## 2024-01-14 NOTE — Progress Notes (Signed)
 His iron  levels are on the lowest range of normal.  I believe this is probably what is causing most of his anemia.  As we discussed in the past, he may benefit from iron  infusions.  Recommend referral to hematology if he is agreeable.  He should continue his oral iron  in the meantime.

## 2024-01-19 ENCOUNTER — Inpatient Hospital Stay

## 2024-01-19 ENCOUNTER — Telehealth: Payer: Self-pay

## 2024-01-19 ENCOUNTER — Encounter: Payer: Self-pay | Admitting: Hematology and Oncology

## 2024-01-19 ENCOUNTER — Inpatient Hospital Stay: Attending: Hematology and Oncology | Admitting: Hematology and Oncology

## 2024-01-19 ENCOUNTER — Other Ambulatory Visit: Payer: Self-pay

## 2024-01-19 VITALS — BP 141/63 | HR 67 | Resp 18 | Ht 62.0 in | Wt 132.4 lb

## 2024-01-19 DIAGNOSIS — N184 Chronic kidney disease, stage 4 (severe): Secondary | ICD-10-CM

## 2024-01-19 DIAGNOSIS — N183 Chronic kidney disease, stage 3 unspecified: Secondary | ICD-10-CM

## 2024-01-19 DIAGNOSIS — E611 Iron deficiency: Secondary | ICD-10-CM

## 2024-01-19 DIAGNOSIS — D509 Iron deficiency anemia, unspecified: Secondary | ICD-10-CM | POA: Diagnosis not present

## 2024-01-19 DIAGNOSIS — E038 Other specified hypothyroidism: Secondary | ICD-10-CM | POA: Insufficient documentation

## 2024-01-19 DIAGNOSIS — D472 Monoclonal gammopathy: Secondary | ICD-10-CM | POA: Diagnosis not present

## 2024-01-19 LAB — ABO/RH: ABO/RH(D): O POS

## 2024-01-19 LAB — CMP (CANCER CENTER ONLY)
ALT: 11 U/L (ref 0–44)
AST: 15 U/L (ref 15–41)
Albumin: 3.1 g/dL — ABNORMAL LOW (ref 3.5–5.0)
Alkaline Phosphatase: 70 U/L (ref 38–126)
Anion gap: 4 — ABNORMAL LOW (ref 5–15)
BUN: 52 mg/dL — ABNORMAL HIGH (ref 8–23)
CO2: 21 mmol/L — ABNORMAL LOW (ref 22–32)
Calcium: 8.5 mg/dL — ABNORMAL LOW (ref 8.9–10.3)
Chloride: 113 mmol/L — ABNORMAL HIGH (ref 98–111)
Creatinine: 2.91 mg/dL — ABNORMAL HIGH (ref 0.61–1.24)
GFR, Estimated: 20 mL/min — ABNORMAL LOW (ref >60)
Glucose, Bld: 100 mg/dL — ABNORMAL HIGH (ref 70–99)
Potassium: 5.5 mmol/L — ABNORMAL HIGH (ref 3.5–5.1)
Sodium: 138 mmol/L (ref 135–145)
Total Bilirubin: 0.3 mg/dL (ref 0.0–1.2)
Total Protein: 6.3 g/dL — ABNORMAL LOW (ref 6.5–8.1)

## 2024-01-19 LAB — CBC WITH DIFFERENTIAL (CANCER CENTER ONLY)
Abs Immature Granulocytes: 0.01 K/uL (ref 0.00–0.07)
Basophils Absolute: 0.1 K/uL (ref 0.0–0.1)
Basophils Relative: 1 %
Eosinophils Absolute: 0.2 K/uL (ref 0.0–0.5)
Eosinophils Relative: 4 %
HCT: 24.9 % — ABNORMAL LOW (ref 39.0–52.0)
Hemoglobin: 8.2 g/dL — ABNORMAL LOW (ref 13.0–17.0)
Immature Granulocytes: 0 %
Lymphocytes Relative: 22 %
Lymphs Abs: 1.4 K/uL (ref 0.7–4.0)
MCH: 29.7 pg (ref 26.0–34.0)
MCHC: 32.9 g/dL (ref 30.0–36.0)
MCV: 90.2 fL (ref 80.0–100.0)
Monocytes Absolute: 0.4 K/uL (ref 0.1–1.0)
Monocytes Relative: 6 %
Neutro Abs: 4.2 K/uL (ref 1.7–7.7)
Neutrophils Relative %: 67 %
Platelet Count: 278 K/uL (ref 150–400)
RBC: 2.76 MIL/uL — ABNORMAL LOW (ref 4.22–5.81)
RDW: 15.9 % — ABNORMAL HIGH (ref 11.5–15.5)
WBC Count: 6.3 K/uL (ref 4.0–10.5)
nRBC: 0 % (ref 0.0–0.2)

## 2024-01-19 LAB — RETICULOCYTES
Immature Retic Fract: 14.6 % (ref 2.3–15.9)
RBC.: 2.73 MIL/uL — ABNORMAL LOW (ref 4.22–5.81)
Retic Count, Absolute: 32.5 K/uL (ref 19.0–186.0)
Retic Ct Pct: 1.2 % (ref 0.4–3.1)

## 2024-01-19 LAB — LACTATE DEHYDROGENASE: LDH: 185 U/L (ref 98–192)

## 2024-01-19 LAB — TSH: TSH: 8.81 u[IU]/mL — ABNORMAL HIGH (ref 0.350–4.500)

## 2024-01-19 LAB — SEDIMENTATION RATE: Sed Rate: 81 mm/h — ABNORMAL HIGH (ref 0–16)

## 2024-01-19 LAB — SAMPLE TO BLOOD BANK

## 2024-01-19 NOTE — Assessment & Plan Note (Addendum)
 He has severe chronic kidney disease I can see referral being sent early this year The patient himself or his wife cannot recall that he was seen by nephrologist I will reach out to nephrology clinic to see if they could expedite appointment I will order myeloma panel to rule that out as potential cause of his severe anemia If his iron  studies show adequate replacement, we can initiate erythropoietin  stimulating agents in the future

## 2024-01-19 NOTE — Progress Notes (Signed)
 [facility] CONSULT NOTE  Patient Care Team: Kennyth Worth HERO, MD as PCP - General (Family Medicine)  ASSESSMENT & PLAN:  Iron  deficiency anemia He has borderline iron  deficiency with borderline low iron  saturation and borderline ferritin level He started taking oral iron  pills 4 days ago and appears to tolerate that well Repeat stat CBC show improvement of his hemoglobin He does not need blood transfusion but we will send his blood type to the blood bank in case we need blood transfusion in the future  Subclinical hypothyroidism TSH could be elevated in the setting of illness but it appears to be consistently elevated Will reach out to his primary care doctor to discuss potential thyroid  replacement therapy  Chronic kidney disease (CKD), stage IV (severe) (HCC) He has severe chronic kidney disease I can see referral being sent early this year The patient himself or his wife cannot recall that he was seen by nephrologist I will reach out to nephrology clinic to see if they could expedite appointment I will order myeloma panel to rule that out as potential cause of his severe anemia If his iron  studies show adequate replacement, we can initiate erythropoietin  stimulating agents in the future  Orders Placed This Encounter  Procedures   CMP (Cancer Center only)    Standing Status:   Future    Number of Occurrences:   1    Expiration Date:   01/18/2025   CBC with Differential (Cancer Center Only)    Standing Status:   Future    Number of Occurrences:   1    Expiration Date:   01/18/2025   Reticulocytes    Standing Status:   Future    Number of Occurrences:   1    Expiration Date:   01/18/2025   Sedimentation rate    Standing Status:   Future    Number of Occurrences:   1    Expiration Date:   01/18/2025   TSH    Standing Status:   Future    Number of Occurrences:   1    Expiration Date:   01/18/2025   Erythropoietin     Standing Status:   Future    Number of  Occurrences:   1    Expiration Date:   01/18/2025   Multiple Myeloma Panel (SPEP&IFE w/QIG)    Standing Status:   Standing    Number of Occurrences:   22    Expiration Date:   01/18/2025   Kappa/lambda light chains    Standing Status:   Standing    Number of Occurrences:   22    Expiration Date:   01/18/2025   Lactate dehydrogenase    Standing Status:   Future    Number of Occurrences:   1    Expiration Date:   01/18/2025   ABO/Rh    Standing Status:   Future    Number of Occurrences:   1    Expiration Date:   01/18/2025   Sample to Blood Bank    Standing Status:   Standing    Number of Occurrences:   33    Expiration Date:   01/18/2025    All questions were answered. The patient knows to call the clinic with any problems, questions or concerns. There is some language barrier Overall, spent over 60 minutes with the patient  The total time spent in the appointment was 80 minutes encounter with patients including review of chart and various tests results, discussions about  plan of care and coordination of care plan  Almarie Bedford, MD 8/4/20251:40 PM   CHIEF COMPLAINTS/PURPOSE OF CONSULTATION:  Anemia  HISTORY OF PRESENTING ILLNESS:  Randall Williams 89 y.o. male is here because of anemia  He was found to have abnormal CBC from recent blood work The patient is here accompanied by his wife I have the opportunity to review his CBC dated back to 2015 On March 06, 2014, his hemoglobin was 15.3 His last normal CBC was on November 17, 2017 with hemoglobin of 13.4 On June 28, 2020, hemoglobin was 11.9 On 11/23/2021, hemoglobin was 9.6 On July 12, 2022, hemoglobin was 10.2 On June 17, 2023, hemoglobin was 8.5 On June 27, 2023, hemoglobin was 8.7 On November 24, 2023, hemoglobin was 9.1 He was recently hospitalized for small bowel obstruction On December 08, 2023, hemoglobin was 8.1 On 01/07/2024, hemoglobin was 7.7  In addition, he is also noted to have progressive renal failure On  March 06, 2014, his creatinine was 1.35 Starting on June 28, 2020, his creatinine was 1.71 On June 27, 2023, creatinine was 2.43 On November 24, 2023, creatinine was 3.03 On December 08, 2023, creatinine was 2.81  He denies recent chest pain on exertion, shortness of breath on minimal exertion, pre-syncopal episodes, or palpitations. He had not noticed any recent bleeding such as epistaxis, hematuria or hematochezia The patient denies over the counter NSAID ingestion. He is not on antiplatelets agents.  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 His age appropriate screening programs are up-to-date. He denies any pica and eats a variety of diet.  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   Small bowel obstruction (HCC) 03/06/2014    SURGICAL HISTORY: Past Surgical History:  Procedure Laterality Date   LAPAROSCOPIC CHOLECYSTECTOMY  2005    SOCIAL HISTORY: Social History   Socioeconomic History   Marital status: Married    Spouse name: Not on file   Number of children: Not on file   Years of education: Not on file   Highest education level: Not on file  Occupational History   Not on file  Tobacco Use   Smoking status: Former    Current packs/day: 0.00    Types: Cigarettes    Quit date: 06/17/1968    Years since quitting: 55.6   Smokeless tobacco: Never  Substance and Sexual Activity   Alcohol use: No   Drug use: No   Sexual activity: Not on file  Other Topics Concern   Not on file  Social History Narrative   Not on file   Social Drivers of Health   Financial Resource Strain: Not on file  Food Insecurity: No  Food Insecurity (11/24/2023)   Hunger Vital Sign    Worried About Running Out of Food in the Last Year: Never true    Ran Out of Food in the Last Year: Never true  Transportation Needs: Patient Declined (11/24/2023)   PRAPARE - Administrator, Civil Service (Medical): Patient declined    Lack of Transportation (Non-Medical): Patient declined  Physical Activity: Not on file  Stress: Not on file  Social Connections: Patient Declined (11/24/2023)   Social Connection and Isolation Panel    Frequency of Communication with Friends and Family: Patient declined    Frequency of Social Gatherings with Friends and Family: Patient declined    Attends Religious Services: Patient declined    Database administrator or Organizations: Patient declined    Attends Banker Meetings: Patient declined    Marital Status: Patient declined  Intimate Partner Violence: Patient Declined (11/24/2023)   Humiliation, Afraid, Rape, and Kick questionnaire    Fear of Current or Ex-Partner: Patient declined    Emotionally Abused: Patient declined    Physically Abused: Patient declined    Sexually Abused: Patient declined    FAMILY HISTORY: No family history on file.  ALLERGIES:  is allergic to synthroid  [levothyroxine  sodium].  MEDICATIONS:  Current Outpatient Medications  Medication Sig Dispense Refill   desonide  (DESOWEN ) 0.05 % cream Apply topically 2 (two) times daily. 30 g 0   Iron , Ferrous Sulfate , 325 (65 Fe) MG TABS Take 325 mg by mouth daily. 30 tablet 3   Multiple Vitamins-Minerals (CENTRUM SILVER 50+MEN) TABS Take 1 tablet by mouth daily in the afternoon.     tamsulosin  (FLOMAX ) 0.4 MG CAPS capsule TAKE ONE CAPSULE BY MOUTH EVERY EVENING AFTER DINNER 90 capsule 3   vitamin C (ASCORBIC ACID) 500 MG tablet Take 500 mg by mouth daily.     No current facility-administered medications for this visit.    REVIEW OF SYSTEMS:  All other systems were reviewed with the patient and are  negative.  PHYSICAL EXAMINATION: ECOG PERFORMANCE STATUS: 1 - Symptomatic but completely ambulatory  Vitals:   01/19/24 1240  BP: (!) 141/63  Pulse: 67  Resp: 18  SpO2: 100%   Filed Weights   01/19/24 1240  Weight: 132 lb 6.4 oz (60.1 kg)    GENERAL:alert, no distress and comfortable.  He looks pale PSYCH: alert & oriented x 3 with fluent speech NEURO: no focal motor/sensory deficits  RADIOGRAPHIC STUDIES: I have personally reviewed his CT imaging from June 2025 I have personally reviewed the radiological images as listed and agreed with the findings in the report.

## 2024-01-19 NOTE — Telephone Encounter (Signed)
Attempted to call patient on primary number regarding today's appointment. No answer. Left voicemail providing patient with clinic number to reschedule appointments.

## 2024-01-19 NOTE — Assessment & Plan Note (Signed)
 TSH could be elevated in the setting of illness but it appears to be consistently elevated Will reach out to his primary care doctor to discuss potential thyroid  replacement therapy

## 2024-01-19 NOTE — Assessment & Plan Note (Addendum)
 He has borderline iron  deficiency with borderline low iron  saturation and borderline ferritin level He started taking oral iron  pills 4 days ago and appears to tolerate that well Repeat stat CBC show improvement of his hemoglobin He does not need blood transfusion but we will send his blood type to the blood bank in case we need blood transfusion in the future

## 2024-01-19 NOTE — Telephone Encounter (Signed)
 Received confirmation of successful fax transmission for nephrology referral.

## 2024-01-20 LAB — KAPPA/LAMBDA LIGHT CHAINS
Kappa free light chain: 53.7 mg/L — ABNORMAL HIGH (ref 3.3–19.4)
Kappa, lambda light chain ratio: 1.39 (ref 0.26–1.65)
Lambda free light chains: 38.5 mg/L — ABNORMAL HIGH (ref 5.7–26.3)

## 2024-01-20 LAB — ERYTHROPOIETIN: Erythropoietin: 10.6 m[IU]/mL (ref 2.6–18.5)

## 2024-01-22 LAB — MULTIPLE MYELOMA PANEL, SERUM
Albumin SerPl Elph-Mcnc: 2.6 g/dL — ABNORMAL LOW (ref 2.9–4.4)
Albumin/Glob SerPl: 0.9 (ref 0.7–1.7)
Alpha 1: 0.2 g/dL (ref 0.0–0.4)
Alpha2 Glob SerPl Elph-Mcnc: 0.8 g/dL (ref 0.4–1.0)
B-Globulin SerPl Elph-Mcnc: 0.9 g/dL (ref 0.7–1.3)
Gamma Glob SerPl Elph-Mcnc: 1.1 g/dL (ref 0.4–1.8)
Globulin, Total: 3.1 g/dL (ref 2.2–3.9)
IgA: 105 mg/dL (ref 61–437)
IgG (Immunoglobin G), Serum: 1311 mg/dL (ref 603–1613)
IgM (Immunoglobulin M), Srm: 9 mg/dL — ABNORMAL LOW (ref 15–143)
M Protein SerPl Elph-Mcnc: 0.7 g/dL — ABNORMAL HIGH
Total Protein ELP: 5.7 g/dL — ABNORMAL LOW (ref 6.0–8.5)

## 2024-01-30 ENCOUNTER — Telehealth: Payer: Self-pay

## 2024-01-30 ENCOUNTER — Inpatient Hospital Stay (HOSPITAL_BASED_OUTPATIENT_CLINIC_OR_DEPARTMENT_OTHER): Admitting: Hematology and Oncology

## 2024-01-30 ENCOUNTER — Encounter: Payer: Self-pay | Admitting: Hematology and Oncology

## 2024-01-30 DIAGNOSIS — E038 Other specified hypothyroidism: Secondary | ICD-10-CM | POA: Diagnosis not present

## 2024-01-30 DIAGNOSIS — D509 Iron deficiency anemia, unspecified: Secondary | ICD-10-CM | POA: Diagnosis not present

## 2024-01-30 DIAGNOSIS — D631 Anemia in chronic kidney disease: Secondary | ICD-10-CM | POA: Diagnosis not present

## 2024-01-30 DIAGNOSIS — D472 Monoclonal gammopathy: Secondary | ICD-10-CM | POA: Insufficient documentation

## 2024-01-30 DIAGNOSIS — N184 Chronic kidney disease, stage 4 (severe): Secondary | ICD-10-CM

## 2024-01-30 MED ORDER — LEVOTHYROXINE SODIUM 25 MCG PO TABS: 25.0000 ug | ORAL_TABLET | Freq: Every day | ORAL | 3 refills | Status: DC

## 2024-01-30 NOTE — Assessment & Plan Note (Addendum)
 He has severe chronic kidney disease Nephrology consult is pending We discussed the role of darbepoetin injection and he agreed to proceed I will schedule that for next month after IV iron  infusion

## 2024-01-30 NOTE — Progress Notes (Signed)
 [facility] OFFICE PROGRESS NOTE  Randall Worth HERO, MD  ASSESSMENT & PLAN:  Assessment & Plan Iron  deficiency anemia, unspecified iron  deficiency anemia type The patient has component of iron  deficiency anemia In order to get good results from darbepoetin injection, I recommend 1 dose of intravenous iron  treatment  The most likely cause of his anemia is due to chronic blood loss/malabsorption syndrome. We discussed some of the risks, benefits, and alternatives of intravenous iron  infusions. The patient is symptomatic from anemia and the iron  level is critically low. He tolerated oral iron  supplement poorly and desires to achieved higher levels of iron  faster for adequate hematopoesis. Some of the side-effects to be expected including risks of infusion reactions, phlebitis, headaches, nausea and fatigue.  The patient is willing to proceed. Patient education material was dispensed.  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   INTERVAL HISTORY: Patient returns for recurrent anemia Symptoms of anemia includes fatigue and pallor We reviewed CBC, CMP, myeloma panel, iron  studies, vitamin B12, thyroid  function test Interpreter is present We discussed role of thyroid  supplement, IV iron , darbepoetin injection and we discussed natural history of MGUS  SUMMARY OF  HEMATOLOGIC HISTORY:  He was found to have abnormal CBC from recent blood work The patient is here accompanied by his wife I have the opportunity to review his CBC dated back to 2015 On March 06, 2014, his hemoglobin was 15.3 His last normal CBC was on November 17, 2017 with hemoglobin of 13.4 On June 28, 2020, hemoglobin was 11.9 On 11/23/2021, hemoglobin was 9.6 On July 12, 2022, hemoglobin was 10.2 On June 17, 2023, hemoglobin was 8.5 On June 27, 2023, hemoglobin was 8.7 On November 24, 2023, hemoglobin was 9.1 He was recently hospitalized for small bowel obstruction On December 08, 2023, hemoglobin was 8.1 On 01/07/2024, hemoglobin was 7.7  In addition, he is also noted to have progressive renal failure On March 06, 2014, his creatinine was 1.35 Starting on June 28, 2020, his creatinine was 1.71 On June 27, 2023, creatinine was 2.43 On November 24, 2023, creatinine was 3.03 On December 08, 2023, creatinine was 2.81  He denies recent chest pain on exertion, shortness of breath on minimal exertion, pre-syncopal episodes, or palpitations. He had not noticed any recent bleeding such as epistaxis, hematuria or hematochezia The patient denies over the counter NSAID ingestion. He is not on antiplatelets agents.  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 His age appropriate screening programs are up-to-date. He denies any pica and eats a variety of diet.  However, in going through his diet history, he has very low iron  rich intake of food He never donated blood or received blood transfusion The patient was prescribed oral iron  supplements and he takes 1 hour before meals for the last 4 days Blood work from August 2025 review MGUS, chronic kidney  disease and borderline iron  deficiency  Lab Results  Component Value Date   VITAMINB12 380 01/12/2024   FERRITIN 24 01/12/2024   Vitals:   01/30/24 1037  BP: (!) (P) 133/58  Pulse: (P) 76  Resp: (P) 18   Temp: (P) 98.4 F (36.9 C)  SpO2: (P) 100%

## 2024-01-30 NOTE — Telephone Encounter (Signed)
 Dr. Lonn, patient will be scheduled as soon as possible.  Auth Submission: NO AUTH NEEDED Site of care: Site of care: [facility] Payer: Medicare A/B with AARP supplement Medication & CPT/J Code(s) submitted: Feraheme (ferumoxytol) [id number] Diagnosis Code:  Route of submission (phone, fax, portal):  Phone # Fax # Auth type: Buy/Bill PB Units/visits requested: 510mg  x 1 dose Reference number:  Approval from: 01/30/24 to 05/31/24

## 2024-01-30 NOTE — Assessment & Plan Note (Addendum)
 The patient has component of iron  deficiency anemia In order to get good results from darbepoetin injection, I recommend 1 dose of intravenous iron  treatment  The most likely cause of his anemia is due to chronic blood loss/malabsorption syndrome. We discussed some of the risks, benefits, and alternatives of intravenous iron  infusions. The patient is symptomatic from anemia and the iron  level is critically low. He tolerated oral iron  supplement poorly and desires to achieved higher levels of iron  faster for adequate hematopoesis. Some of the side-effects to be expected including risks of infusion reactions, phlebitis, headaches, nausea and fatigue.  The patient is willing to proceed. Patient education material was dispensed.  Goal is to keep ferritin level greater than 50 and resolution of anemia He agreed to receive 1 dose of IV Feraheme

## 2024-01-30 NOTE — Assessment & Plan Note (Addendum)
 We discussed natural history of MGUS He will continue surveillance blood work once a year

## 2024-01-30 NOTE — Assessment & Plan Note (Addendum)
 We discussed trial of Synthroid  again and he agreed

## 2024-02-03 ENCOUNTER — Encounter: Payer: Self-pay | Admitting: Physician Assistant

## 2024-02-03 ENCOUNTER — Ambulatory Visit (INDEPENDENT_AMBULATORY_CARE_PROVIDER_SITE_OTHER): Admitting: Physician Assistant

## 2024-02-03 VITALS — BP 130/70 | HR 63 | Temp 97.9°F | Ht 62.0 in | Wt 134.5 lb

## 2024-02-03 DIAGNOSIS — L600 Ingrowing nail: Secondary | ICD-10-CM

## 2024-02-03 MED ORDER — MUPIROCIN 2 % EX OINT: 1.0000 | TOPICAL_OINTMENT | Freq: Two times a day (BID) | CUTANEOUS | 0 refills | Status: AC

## 2024-02-03 NOTE — Progress Notes (Signed)
 Randall Williams is a 89 y.o. male here for a follow up of a pre-existing problem.  History of Present Illness:   Chief Complaint  Patient presents with   Nail Problem    Pt c/o left great toe slightly red and swollen, painful, started 3 days ago, has been soaking foot in warm water and epsom salt. Had some slight bleeding from toe this morning.   Discussed the use of AI scribe software for clinical note transcription with the patient, who gave verbal consent to proceed.  History of Present Illness Randall Williams is an 89 year old male who presents with recurrent issues with his toenail, including pain and discharge.  He has a history of toenail issues on the same toe, previously treated by Dr Tobie in 2023 for excision of ingrown toenail and then Dr Kennyth in Sept 2024 had toenail removal.   Recently, the toenail has grown abnormally, becoming thick and deformed. Two days ago, he experienced pain, bleeding, and watery discharge from the affected toe. He suspects trauma may have exacerbated the issue. He has been using Epsom salt soaks and applying Neosporin. The toenail is now coming out from the side. No similar issues with other toenails.    Past Medical History:  Diagnosis Date   Hypothyroidism 05/15/2016   Lymphoma of lymph nodes of face [facility]) 2008   Small bowel obstruction (HCC) 03/06/2014     Social History   Tobacco Use   Smoking status: Former    Current packs/day: 0.00    Types: Cigarettes    Quit date: 06/17/1968    Years since quitting: 55.6   Smokeless tobacco: Never  Substance Use Topics   Alcohol use: No   Drug use: No    Past Surgical History:  Procedure Laterality Date   LAPAROSCOPIC CHOLECYSTECTOMY  2005    History reviewed. No pertinent family history.  Allergies  Allergen Reactions   Synthroid  [Levothyroxine  Sodium] Hives and Rash    Current Medications:   Current Outpatient Medications:    desonide  (DESOWEN ) 0.05 % cream, Apply topically 2 (two)  times daily., Disp: 30 g, Rfl: 0   Iron , Ferrous Sulfate , 325 (65 Fe) MG TABS, Take 325 mg by mouth daily., Disp: 30 tablet, Rfl: 3   levothyroxine  (SYNTHROID ) 25 MCG tablet, Take 1 tablet (25 mcg total) by mouth daily before breakfast., Disp: 30 tablet, Rfl: 3   Multiple Vitamins-Minerals (CENTRUM SILVER 50+MEN) TABS, Take 1 tablet by mouth daily in the afternoon., Disp: , Rfl:    mupirocin  ointment (BACTROBAN ) 2 %, Apply 1 Application topically 2 (two) times daily., Disp: 22 g, Rfl: 0   tamsulosin  (FLOMAX ) 0.4 MG CAPS capsule, TAKE ONE CAPSULE BY MOUTH EVERY EVENING AFTER DINNER, Disp: 90 capsule, Rfl: 3   vitamin C (ASCORBIC ACID) 500 MG tablet, Take 500 mg by mouth daily., Disp: , Rfl:    Review of Systems:   Negative unless otherwise specified per HPI.  Vitals:   Vitals:   02/03/24 1304  BP: 130/70  Pulse: 63  Temp: 97.9 F (36.6 C)  TempSrc: Temporal  SpO2: 98%  Weight: 134 lb 8 oz (61 kg)  Height: 5' 2 (1.575 m)     Body mass index is 24.6 kg/m.  Physical Exam:   Physical Exam Vitals and nursing note reviewed.  Constitutional:      Appearance: He is well-developed.  HENT:     Head: Normocephalic.  Eyes:     Conjunctiva/sclera: Conjunctivae normal.     Pupils: Pupils  are equal, round, and reactive to light.  Pulmonary:     Effort: Pulmonary effort is normal.  Musculoskeletal:        General: Normal range of motion.     Cervical back: Normal range of motion.  Feet:     Comments: Left great toe with thickened dystrophic nail Scant clear discharge from lateral nail fold with slight tenderness to palpation  Skin:    General: Skin is warm and dry.  Neurological:     Mental Status: He is alert and oriented to person, place, and time.  Psychiatric:        Behavior: Behavior normal.        Thought Content: Thought content normal.        Judgment: Judgment normal.     Assessment and Plan:   Assessment and Plan Assessment & Plan Ingrown toenail  Recurrent  ingrown toenail with pain, bleeding, and discharge. Previous podiatry interventions noted. Thickened nail suggests possible fungal involvement. No systemic symptoms for oral antibiotics. - Refer to podiatry for evaluation and potential surgical intervention. - Prescribe mupirocin  topical antibiotic ointment. - Advise Epsom salt soaks once daily. - Coordinate with Dr Anthony office for appointment.     Lucie Buttner, PA-C

## 2024-02-23 ENCOUNTER — Other Ambulatory Visit: Payer: Self-pay

## 2024-02-23 ENCOUNTER — Ambulatory Visit

## 2024-02-23 ENCOUNTER — Other Ambulatory Visit

## 2024-02-23 ENCOUNTER — Ambulatory Visit: Admitting: Hematology and Oncology

## 2024-02-23 DIAGNOSIS — N184 Chronic kidney disease, stage 4 (severe): Secondary | ICD-10-CM

## 2024-02-25 DIAGNOSIS — Z23 Encounter for immunization: Secondary | ICD-10-CM | POA: Diagnosis not present

## 2024-02-27 ENCOUNTER — Ambulatory Visit (INDEPENDENT_AMBULATORY_CARE_PROVIDER_SITE_OTHER): Admitting: Family Medicine

## 2024-02-27 ENCOUNTER — Telehealth: Payer: Self-pay | Admitting: Hematology and Oncology

## 2024-02-27 ENCOUNTER — Encounter: Payer: Self-pay | Admitting: Family Medicine

## 2024-02-27 VITALS — BP 138/70 | HR 63 | Temp 97.3°F | Ht 62.0 in | Wt 132.2 lb

## 2024-02-27 DIAGNOSIS — H6120 Impacted cerumen, unspecified ear: Secondary | ICD-10-CM

## 2024-02-27 DIAGNOSIS — D509 Iron deficiency anemia, unspecified: Secondary | ICD-10-CM

## 2024-02-27 DIAGNOSIS — H6123 Impacted cerumen, bilateral: Secondary | ICD-10-CM

## 2024-02-27 DIAGNOSIS — E038 Other specified hypothyroidism: Secondary | ICD-10-CM

## 2024-02-27 MED ORDER — IRON (FERROUS SULFATE) 325 (65 FE) MG PO TABS: 325.0000 mg | ORAL_TABLET | Freq: Every day | ORAL | 3 refills | Status: AC

## 2024-02-27 NOTE — Telephone Encounter (Signed)
 Scheduled appointments per 9/11 secure chat. Called and left VM for the patient with the appointment details.

## 2024-02-27 NOTE — Assessment & Plan Note (Signed)
 Following with hematology now for this.  He is on iron  supplementation.  Will refill this today.  Recheck iron  panel in 3-6 months.

## 2024-02-27 NOTE — Patient Instructions (Addendum)
 It was very nice to see you today!  VISIT SUMMARY: Today, you visited us  for earwax buildup in both ears and to discuss your iron  deficiency anemia. We addressed the earwax issue and reviewed your current treatment for anemia.  YOUR PLAN: CERUMEN IMPACTION (EARWAX BUILDUP): You have earwax buildup in both ears, which can be worsened by using hearing aids. -Continue using debrox drops twice a week to manage and prevent earwax buildup.  IRON  DEFICIENCY ANEMIA: You have a history of iron  deficiency anemia and are currently taking oral iron  supplements. -Continue with your oral iron  supplementation. -We will recheck your iron  levels in a few months to ensure they are improving.  Return if symptoms worsen or fail to improve.   Take care, Dr Kennyth  PLEASE NOTE:  If you had any lab tests, please let us  know if you have not heard back within a few days. You may see your results on mychart before we have a chance to review them but we will give you a call once they are reviewed by us .   If we ordered any referrals today, please let us  know if you have not heard from their office within the next week.   If you had any urgent prescriptions sent in today, please check with the pharmacy within an hour of our visit to make sure the prescription was transmitted appropriately.   Please try these tips to maintain a healthy lifestyle:  Eat at least 3 REAL meals and 1-2 snacks per day.  Aim for no more than 5 hours between eating.  If you eat breakfast, please do so within one hour of getting up.   Each meal should contain half fruits/vegetables, one quarter protein, and one quarter carbs (no bigger than a computer mouse)  Cut down on sweet beverages. This includes juice, soda, and sweet tea.   Drink at least 1 glass of water with each meal and aim for at least 8 glasses per day  Exercise at least 150 minutes every week.

## 2024-02-27 NOTE — Assessment & Plan Note (Signed)
 Now back on Synthroid  25 mcg daily.  Recheck TSH in 4 to 6 weeks.

## 2024-02-27 NOTE — Progress Notes (Signed)
   Randall Williams is a 89 y.o. male who presents today for an office visit.  Assessment/Plan:  New/Acute Problems: Cerumen Impaction  Successfully irrigated by RMA Today.  Tolerated well.  Can use over-the-counter Debrox as needed to prevent buildup.Assessment and Plan  Chronic Problems Addressed Today: Iron  deficiency anemia Following with hematology now for this.  He is on iron  supplementation.  Will refill this today.  Recheck iron  panel in 3-6 months.   Subclinical hypothyroidism Now back on Synthroid  25 mcg daily.  Recheck TSH in 4 to 6 weeks.     Subjective:  HPI:  See assessment / plan for status of chronic conditions.   Discussed the use of AI scribe software for clinical note transcription with the patient, who gave verbal consent to proceed.  History of Present Illness Randall Williams is an 89 year old male who presents with earwax buildup. He is accompanied by a family member.  He experiences earwax buildup in both ears, which has required medical attention for removal. The accumulation was significant enough to necessitate a visit for flushing out.   He has a history of anemia, for which he underwent laboratory tests and was evaluated for iron  levels. Although he was supposed to receive an iron  transfusion, it was not administered. Despite this, he feels better and continues with oral iron  supplementation. Tests for cancer were negative.  He uses hearing aids and prefers those that operate with batteries rather than being connected to the internet. He has been using hearing aids from [employer] and is considering returning there to explore options that meet his preferences.         Objective:  Physical Exam: BP 138/70   Pulse 63   Temp (!) 97.3 F (36.3 C) (Temporal)   Ht 5' 2 (1.575 m)   Wt 132 lb 3.2 oz (60 kg)   SpO2 99%   BMI 24.18 kg/m   Gen: No acute distress, resting comfortably HEENT: TMs clear after irrigation CV: Regular rate and rhythm with no  murmurs appreciated Pulm: Normal work of breathing, clear to auscultation bilaterally with no crackles, wheezes, or rhonchi Neuro: Grossly normal, moves all extremities Psych: Normal affect and thought content      Skyra Crichlow M. Kennyth, MD 02/27/2024 9:44 AM

## 2024-03-01 ENCOUNTER — Other Ambulatory Visit: Payer: Self-pay | Admitting: Hematology and Oncology

## 2024-03-01 DIAGNOSIS — D631 Anemia in chronic kidney disease: Secondary | ICD-10-CM

## 2024-03-01 DIAGNOSIS — D509 Iron deficiency anemia, unspecified: Secondary | ICD-10-CM

## 2024-03-01 DIAGNOSIS — N184 Chronic kidney disease, stage 4 (severe): Secondary | ICD-10-CM

## 2024-03-02 ENCOUNTER — Inpatient Hospital Stay: Attending: Hematology and Oncology

## 2024-03-02 ENCOUNTER — Inpatient Hospital Stay

## 2024-03-02 ENCOUNTER — Encounter: Payer: Self-pay | Admitting: Hematology and Oncology

## 2024-03-02 ENCOUNTER — Inpatient Hospital Stay: Admitting: Hematology and Oncology

## 2024-03-03 DIAGNOSIS — H353232 Exudative age-related macular degeneration, bilateral, with inactive choroidal neovascularization: Secondary | ICD-10-CM | POA: Diagnosis not present

## 2024-04-07 DIAGNOSIS — L600 Ingrowing nail: Secondary | ICD-10-CM | POA: Diagnosis not present

## 2024-04-07 DIAGNOSIS — M2011 Hallux valgus (acquired), right foot: Secondary | ICD-10-CM | POA: Diagnosis not present

## 2024-04-07 DIAGNOSIS — M2042 Other hammer toe(s) (acquired), left foot: Secondary | ICD-10-CM | POA: Diagnosis not present

## 2024-04-07 DIAGNOSIS — I70203 Unspecified atherosclerosis of native arteries of extremities, bilateral legs: Secondary | ICD-10-CM | POA: Diagnosis not present

## 2024-04-07 DIAGNOSIS — L03032 Cellulitis of left toe: Secondary | ICD-10-CM | POA: Diagnosis not present

## 2024-04-07 DIAGNOSIS — M2041 Other hammer toe(s) (acquired), right foot: Secondary | ICD-10-CM | POA: Diagnosis not present

## 2024-04-07 DIAGNOSIS — L603 Nail dystrophy: Secondary | ICD-10-CM | POA: Diagnosis not present

## 2024-04-07 DIAGNOSIS — M2012 Hallux valgus (acquired), left foot: Secondary | ICD-10-CM | POA: Diagnosis not present

## 2024-04-20 ENCOUNTER — Other Ambulatory Visit: Payer: Self-pay | Admitting: Family Medicine

## 2024-04-22 DIAGNOSIS — L03032 Cellulitis of left toe: Secondary | ICD-10-CM | POA: Diagnosis not present

## 2024-04-22 DIAGNOSIS — M2011 Hallux valgus (acquired), right foot: Secondary | ICD-10-CM | POA: Diagnosis not present

## 2024-04-22 DIAGNOSIS — I70203 Unspecified atherosclerosis of native arteries of extremities, bilateral legs: Secondary | ICD-10-CM | POA: Diagnosis not present

## 2024-04-22 DIAGNOSIS — M2012 Hallux valgus (acquired), left foot: Secondary | ICD-10-CM | POA: Diagnosis not present

## 2024-04-22 DIAGNOSIS — L603 Nail dystrophy: Secondary | ICD-10-CM | POA: Diagnosis not present

## 2024-04-22 DIAGNOSIS — M2042 Other hammer toe(s) (acquired), left foot: Secondary | ICD-10-CM | POA: Diagnosis not present

## 2024-04-22 DIAGNOSIS — M2041 Other hammer toe(s) (acquired), right foot: Secondary | ICD-10-CM | POA: Diagnosis not present

## 2024-04-22 DIAGNOSIS — L600 Ingrowing nail: Secondary | ICD-10-CM | POA: Diagnosis not present

## 2024-05-06 DIAGNOSIS — M2041 Other hammer toe(s) (acquired), right foot: Secondary | ICD-10-CM | POA: Diagnosis not present

## 2024-05-06 DIAGNOSIS — L600 Ingrowing nail: Secondary | ICD-10-CM | POA: Diagnosis not present

## 2024-05-06 DIAGNOSIS — M2042 Other hammer toe(s) (acquired), left foot: Secondary | ICD-10-CM | POA: Diagnosis not present

## 2024-05-06 DIAGNOSIS — L603 Nail dystrophy: Secondary | ICD-10-CM | POA: Diagnosis not present

## 2024-05-06 DIAGNOSIS — M2012 Hallux valgus (acquired), left foot: Secondary | ICD-10-CM | POA: Diagnosis not present

## 2024-05-06 DIAGNOSIS — L03032 Cellulitis of left toe: Secondary | ICD-10-CM | POA: Diagnosis not present

## 2024-05-06 DIAGNOSIS — I70203 Unspecified atherosclerosis of native arteries of extremities, bilateral legs: Secondary | ICD-10-CM | POA: Diagnosis not present

## 2024-05-06 DIAGNOSIS — M2011 Hallux valgus (acquired), right foot: Secondary | ICD-10-CM | POA: Diagnosis not present

## 2024-05-12 ENCOUNTER — Other Ambulatory Visit: Payer: Self-pay | Admitting: Family Medicine

## 2024-05-15 ENCOUNTER — Other Ambulatory Visit: Payer: Self-pay | Admitting: Family Medicine

## 2024-05-15 DIAGNOSIS — R350 Frequency of micturition: Secondary | ICD-10-CM

## 2024-06-02 ENCOUNTER — Telehealth: Payer: Self-pay

## 2024-06-02 NOTE — Telephone Encounter (Signed)
 [facility] called in about patient medication. They will be switching manufactures. and want the ok from pt and also provider.

## 2024-06-03 NOTE — Telephone Encounter (Signed)
 I do not know what medication I have not seen the pateint for a while

## 2024-06-07 ENCOUNTER — Encounter: Payer: Self-pay | Admitting: Hematology and Oncology

## 2024-06-21 ENCOUNTER — Other Ambulatory Visit (HOSPITAL_BASED_OUTPATIENT_CLINIC_OR_DEPARTMENT_OTHER): Payer: Self-pay

## 2024-06-21 ENCOUNTER — Ambulatory Visit: Admitting: Family Medicine

## 2024-06-21 ENCOUNTER — Encounter: Payer: Self-pay | Admitting: Hematology and Oncology

## 2024-06-21 ENCOUNTER — Encounter: Payer: Self-pay | Admitting: Family Medicine

## 2024-06-21 ENCOUNTER — Telehealth: Payer: Self-pay | Admitting: Family Medicine

## 2024-06-21 VITALS — BP 124/60 | HR 73 | Temp 97.4°F | Wt 135.2 lb

## 2024-06-21 DIAGNOSIS — R350 Frequency of micturition: Secondary | ICD-10-CM

## 2024-06-21 DIAGNOSIS — L309 Dermatitis, unspecified: Secondary | ICD-10-CM

## 2024-06-21 DIAGNOSIS — N184 Chronic kidney disease, stage 4 (severe): Secondary | ICD-10-CM | POA: Diagnosis not present

## 2024-06-21 DIAGNOSIS — E611 Iron deficiency: Secondary | ICD-10-CM

## 2024-06-21 DIAGNOSIS — R600 Localized edema: Secondary | ICD-10-CM | POA: Diagnosis not present

## 2024-06-21 DIAGNOSIS — E038 Other specified hypothyroidism: Secondary | ICD-10-CM | POA: Diagnosis not present

## 2024-06-21 LAB — URINALYSIS, ROUTINE W REFLEX MICROSCOPIC
Bilirubin Urine: NEGATIVE
Ketones, ur: NEGATIVE
Leukocytes,Ua: NEGATIVE
Nitrite: NEGATIVE
Specific Gravity, Urine: 1.015 (ref 1.000–1.030)
Total Protein, Urine: >=300 — AB
Urine Glucose: NEGATIVE
Urobilinogen, UA: 0.2 (ref 0.0–1.0)
pH: 6.5 (ref 5.0–8.0)

## 2024-06-21 LAB — COMPREHENSIVE METABOLIC PANEL WITH GFR
ALT: 16 U/L (ref 3–53)
AST: 21 U/L (ref 5–37)
Albumin: 3 g/dL — ABNORMAL LOW (ref 3.5–5.2)
Alkaline Phosphatase: 68 U/L (ref 39–117)
BUN: 48 mg/dL — ABNORMAL HIGH (ref 6–23)
CO2: 20 meq/L (ref 19–32)
Calcium: 8.2 mg/dL — ABNORMAL LOW (ref 8.4–10.5)
Chloride: 110 meq/L (ref 96–112)
Creatinine, Ser: 3 mg/dL — ABNORMAL HIGH (ref 0.40–1.50)
GFR: 17.83 mL/min — ABNORMAL LOW
Glucose, Bld: 92 mg/dL (ref 70–99)
Potassium: 4.9 meq/L (ref 3.5–5.1)
Sodium: 136 meq/L (ref 135–145)
Total Bilirubin: 0.3 mg/dL (ref 0.2–1.2)
Total Protein: 6.2 g/dL (ref 6.0–8.3)

## 2024-06-21 LAB — CBC
HCT: 24.5 % — ABNORMAL LOW (ref 39.0–52.0)
Hemoglobin: 8.2 g/dL — ABNORMAL LOW (ref 13.0–17.0)
MCHC: 33.3 g/dL (ref 30.0–36.0)
MCV: 90.1 fl (ref 78.0–100.0)
Platelets: 277 K/uL (ref 150.0–400.0)
RBC: 2.72 Mil/uL — ABNORMAL LOW (ref 4.22–5.81)
RDW: 15.8 % — ABNORMAL HIGH (ref 11.5–15.5)
WBC: 6 K/uL (ref 4.0–10.5)

## 2024-06-21 LAB — TSH: TSH: 4.75 u[IU]/mL (ref 0.35–5.50)

## 2024-06-21 LAB — IBC + FERRITIN
Ferritin: 25.5 ng/mL (ref 22.0–322.0)
Iron: 38 ug/dL — ABNORMAL LOW (ref 42–165)
Saturation Ratios: 12.9 % — ABNORMAL LOW (ref 20.0–50.0)
TIBC: 295.4 ug/dL (ref 250.0–450.0)
Transferrin: 211 mg/dL — ABNORMAL LOW (ref 212.0–360.0)

## 2024-06-21 MED ORDER — TRIAMCINOLONE ACETONIDE 0.1 % EX CREA
1.0000 | TOPICAL_CREAM | Freq: Two times a day (BID) | CUTANEOUS | 3 refills | Status: AC
  Filled 2024-06-21 (×2): qty 60, 30d supply, fill #0
  Filled 2024-07-09 (×2): qty 60, 30d supply, fill #1
  Filled 2024-07-23: qty 60, 30d supply, fill #2

## 2024-06-21 NOTE — Assessment & Plan Note (Signed)
On Synthroid 25 mcg daily.  Check TSH.

## 2024-06-21 NOTE — Assessment & Plan Note (Signed)
 Patient with dermatitis on lower extremities likely secondary to venous stasis.  Has had moderate success with desonide  though needs something stronger.  Will try triamcinolone 

## 2024-06-21 NOTE — Telephone Encounter (Signed)
 Pt would like to have lab results mailed to them along with a phone call once they are ready.

## 2024-06-21 NOTE — Progress Notes (Signed)
" ° °  Randall Williams is a 89 y.o. male who presents today for an office visit.  Assessment/Plan:  New/Acute Problems: Leg edema No red flags.  Patient with bilateral 2+ pitting edema that is likely multifactorial in setting of recent increased sodium intake.  Does have known history of CKD 4 which is likely contributing as well.  Likely has some venous insufficiency as well.  Discussed importance of low-sodium diet.  Also recommended conservative measures including leg elevation and compression stockings.  We will check labs today.  We briefly did discuss diuretic however hold off on this for now due to concern for potential side effects. Assessment & Plan Cerumen impaction   Mild cerumen impaction was noted. Ears were flushed to remove cerumen.  Chronic Problems Addressed Today: Subclinical hypothyroidism On Synthroid  25 mcg daily.  Check TSH.  Chronic kidney disease (CKD), stage IV (severe) (HCC) Likely contributing to above leg edema.  We are checking labs today.  He was referred to nephrology last year.  Iron  deficiency Check CBC and iron  panel  Dermatitis Patient with dermatitis on lower extremities likely secondary to venous stasis.  Has had moderate success with desonide  though needs something stronger.  Will try triamcinolone      Subjective:  HPI:  See assessment / plan for status of chronic conditions.   Discussed the use of AI scribe software for clinical note transcription with the patient, who gave verbal consent to proceed.  History of Present Illness Randall Williams is an 89 year old male who presents with leg swelling and itching.  He has been experiencing swelling and itching in his legs for almost two weeks. The swelling is present from the knee down and appears inflamed. He suspects that his diet, particularly his salt intake, may be contributing to the swelling, as he tends to add extra salt to his food. He has been using a cream, possibly desonide , to manage the  itching, but it only provides relief for a couple of hours. Cold water is also used to help reduce inflammation. He has not been using compression stockings or elevating his legs regularly.  He mentions a decrease in hearing, which he attributes to wax buildup. He has not seen a hearing aid specialist recently but has had his hearing aids checked.         Objective:  Physical Exam: BP 124/60   Pulse 73   Temp (!) 97.4 F (36.3 C) (Temporal)   Wt 135 lb 3.2 oz (61.3 kg)   SpO2 98%   BMI 24.73 kg/m   Gen: No acute distress, resting comfortably CV: Regular rate and rhythm with no murmurs appreciated Pulm: Normal work of breathing, clear to auscultation bilaterally with no crackles, wheezes, or rhonchi MUSCULOSKELETAL: 2+ pitting edema to knees bilaterally Neuro: Grossly normal, moves all extremities Psych: Normal affect and thought content      Randall Warshaw M. Kennyth, MD 06/21/2024 9:49 AM  "

## 2024-06-21 NOTE — Assessment & Plan Note (Signed)
 Check CBC and iron panel.

## 2024-06-21 NOTE — Assessment & Plan Note (Signed)
 Likely contributing to above leg edema.  We are checking labs today.  He was referred to nephrology last year.

## 2024-06-21 NOTE — Patient Instructions (Signed)
 It was very nice to see you today!  VISIT SUMMARY: Today, we addressed your leg swelling and itching, kidney function, thyroid  levels, and ear wax buildup.  YOUR PLAN: LEG EDEMA: Swelling in your legs, likely due to high salt intake and fluid retention. -Reduce your dietary sodium intake. -Elevate your legs regularly. -Use compression stockings. -A referral to a nutritionist has been made for dietary management. -Triamcinolone  cream has been prescribed for itching.  CHRONIC KIDNEY DISEASE, STAGE 4: Advanced kidney disease that requires regular monitoring. -Blood work has been ordered to monitor your kidney function and electrolytes.  SUBCLINICAL HYPOTHYROIDISM: Mild underactive thyroid  condition managed with medication. -Continue taking Synthroid  25 mcg daily. -Blood work has been ordered to reassess your thyroid  levels and adjust your medication if needed. -Your Synthroid  prescription has been refilled pending the results.  CERUMEN IMPACTION: Ear wax buildup causing decreased hearing. -Your ears were flushed to remove the ear wax.  Return if symptoms worsen or fail to improve.   Take care, Dr Kennyth  PLEASE NOTE:  If you had any lab tests, please let us  know if you have not heard back within a few days. You may see your results on mychart before we have a chance to review them but we will give you a call once they are reviewed by us .   If we ordered any referrals today, please let us  know if you have not heard from their office within the next week.   If you had any urgent prescriptions sent in today, please check with the pharmacy within an hour of our visit to make sure the prescription was transmitted appropriately.   Please try these tips to maintain a healthy lifestyle:  Eat at least 3 REAL meals and 1-2 snacks per day.  Aim for no more than 5 hours between eating.  If you eat breakfast, please do so within one hour of getting up.   Each meal should contain half  fruits/vegetables, one quarter protein, and one quarter carbs (no bigger than a computer mouse)  Cut down on sweet beverages. This includes juice, soda, and sweet tea.   Drink at least 1 glass of water with each meal and aim for at least 8 glasses per day  Exercise at least 150 minutes every week.

## 2024-06-22 ENCOUNTER — Ambulatory Visit: Payer: Self-pay | Admitting: Family Medicine

## 2024-06-22 DIAGNOSIS — E038 Other specified hypothyroidism: Secondary | ICD-10-CM

## 2024-06-22 LAB — URINE CULTURE
MICRO NUMBER:: 17425823
Result:: NO GROWTH
SPECIMEN QUALITY:: ADEQUATE

## 2024-06-22 NOTE — Progress Notes (Signed)
 His labs are all relatively stable.  He does have anemia which is stable compared to where he has been over the last several months.  He also has decreased kidney function but stable to his baseline over the last several months as well.  He has a leg swelling should improve with the salt avoidance and leg elevation as we discussed at his office visit however I would like to make sure that he has not seen the kidney doctor.  Can we please check with them to make sure he has been seen?

## 2024-06-23 ENCOUNTER — Other Ambulatory Visit (HOSPITAL_BASED_OUTPATIENT_CLINIC_OR_DEPARTMENT_OTHER): Payer: Self-pay

## 2024-06-23 NOTE — Telephone Encounter (Signed)
 Results mail to patient

## 2024-06-23 NOTE — Progress Notes (Signed)
 Urine culture is negative.  He does not have a UTI.

## 2024-06-24 ENCOUNTER — Other Ambulatory Visit (HOSPITAL_BASED_OUTPATIENT_CLINIC_OR_DEPARTMENT_OTHER): Payer: Self-pay

## 2024-06-24 ENCOUNTER — Encounter: Payer: Self-pay | Admitting: Hematology and Oncology

## 2024-06-24 MED ORDER — LEVOTHYROXINE SODIUM 50 MCG PO TABS
50.0000 ug | ORAL_TABLET | Freq: Every day | ORAL | 3 refills | Status: DC
  Filled 2024-06-24: qty 90, 90d supply, fill #0

## 2024-06-30 ENCOUNTER — Encounter: Attending: Family Medicine | Admitting: Skilled Nursing Facility1

## 2024-06-30 ENCOUNTER — Encounter: Payer: Self-pay | Admitting: Skilled Nursing Facility1

## 2024-06-30 DIAGNOSIS — N184 Chronic kidney disease, stage 4 (severe): Secondary | ICD-10-CM | POA: Insufficient documentation

## 2024-06-30 NOTE — Progress Notes (Addendum)
 Medical Nutrition Therapy  Appointment Start time:  3:53  Appointment End time:  4:22  Primary concerns today: itching legs worrying it is an allergy to a food  Referral diagnosis: n18.4  NUTRITION ASSESSMENT    Clinical Medical Hx: stage 4 CKD Medications: see list Labs: RDW 15.8, Iron  38, trasferrin 211, Calcium  8.2, GFR 17.83 Notable Signs/Symptoms: none reported   Lifestyle & Dietary Hx  Pt arrives with his supportive wife. Pt states he had Itching in back and lower legs. Pt states once he stopped overusing salt the swelling in his legs went down and the itching stopped.   Wt Readings from Last 3 Encounters:  06/21/24 135 lb 3.2 oz (61.3 kg)  02/27/24 132 lb 3.2 oz (60 kg)  02/03/24 134 lb 8 oz (61 kg)    Estimated daily fluid intake:  oz Supplements: 325 mg of iron , vitamin C, centrum Sleep:  Stress / self-care:  Current average weekly physical activity:   24-Hr Dietary Recall First Meal: pancakes with quinoa and almond flower or salmon + celery + green onion  +dill + oil and 2 slices whole wheat bread or cereal with nuts Snack: 3 manderines Second Meal: skipped Snack:  Third Meal 5:30: salmon + broccoli and sweet potato; garlic, onion, tomato, lemon Snack: cookie  Beverages: 2 cups coffee + whole milk, water   NUTRITION INTERVENTION  Nutrition education (E-1) on the following topics:  Patient received individualized nutrition education focused on managing Stage 4 Chronic Kidney Disease (eGFR 15-29 mL/min/1.87m). Education was provided using teach-back method, visuals, and CKD-friendly resources, with a focus on preserving remaining kidney function, reducing progression to ESRD, and minimizing complications (e.g., hyperkalemia, hyperphosphatemia, metabolic acidosis). Topics Covered: 1. Protein Intake Educated on moderate protein restriction: Recommended 0.6-0.8 g/kg/day based on adjusted body weight (per KDOQI 2020). Emphasis on high-biological value proteins  (e.g., eggs, poultry, fish) in moderation. Avoidance of excessive animal protein, processed meats. 2. Sodium Management Limit sodium to < 2,300 mg/day (NKF recommendation) to control blood pressure and fluid retention. Educated on label reading, avoiding processed/canned foods, and choosing low-sodium seasonings. 3. Potassium Control Patient instructed to monitor intake of high-potassium foods (e.g., bananas, oranges, tomatoes, potatoes). Potassium restriction individualized depending on serum K+ levels (labs pending). Provided handout listing low-, moderate-, and high-potassium foods. 4. Phosphorus Management Education on limiting inorganic phosphorus additives (common in processed and fast foods). Discussed avoiding high-phosphorus foods like cola drinks, organ meats, dairy (large portions), and processed cheeses. 5. Fluid Management (if applicable) Reviewed signs of fluid overload (edema, SOB) and potential need for fluid restriction (if present). Reinforced adherence to diuretic regimen if prescribed. 7. General CKD Dietary Goals Emphasized diets role in: Slowing progression of kidney disease Managing comorbidities (HTN, DM) Delaying dialysis Encouraged meal planning, home-cooking, and tracking food intake. Provided CKD nutrition educational handouts (NKF/NIDDK). 8. Vitamin/Mineral Considerations Advised against over-the-counter vitamin/mineral supplements without nephrologist approval Renal multivitamin may be needed if dietary intake inadequate (discussed with nephrology team) 9. Physical Activity & Weight Managemen Encouraged light to moderate physical activity, as tolerated, and discussed maintaining healthy BMI (18.5-24.9). Discussed importance of muscle maintenance despite protein restriction. Patient Understanding and Engagement: Patient engaged, asked appropriate questions. Verbalized understanding of dietary restrictions and expressed motivation to make dietary  changes. Demonstrated ability to identify high-potassium and high-phosphorus foods during review activity.   Learning Style & Readiness for Change Teaching method utilized: Visual & Auditory  Demonstrated degree of understanding via: Teach Back  Barriers to learning/adherence to lifestyle change: none  MONITORING & EVALUATION Dietary intake, weekly physical activity  Next Steps  Patient is to call or email with any future questions or concerns.

## 2024-07-07 ENCOUNTER — Other Ambulatory Visit: Payer: Self-pay | Admitting: Family Medicine

## 2024-07-09 ENCOUNTER — Encounter: Payer: Self-pay | Admitting: Hematology and Oncology

## 2024-07-09 ENCOUNTER — Other Ambulatory Visit (HOSPITAL_BASED_OUTPATIENT_CLINIC_OR_DEPARTMENT_OTHER): Payer: Self-pay

## 2024-07-09 ENCOUNTER — Other Ambulatory Visit: Payer: Self-pay

## 2024-07-19 ENCOUNTER — Ambulatory Visit: Admitting: Family Medicine

## 2024-07-22 ENCOUNTER — Encounter: Payer: Self-pay | Admitting: Family Medicine

## 2024-07-22 ENCOUNTER — Ambulatory Visit: Admitting: Family Medicine

## 2024-07-22 ENCOUNTER — Encounter: Payer: Self-pay | Admitting: Hematology and Oncology

## 2024-07-22 ENCOUNTER — Other Ambulatory Visit (HOSPITAL_BASED_OUTPATIENT_CLINIC_OR_DEPARTMENT_OTHER): Payer: Self-pay

## 2024-07-22 VITALS — BP 130/60 | HR 76 | Temp 97.2°F | Ht 62.0 in | Wt 137.2 lb

## 2024-07-22 DIAGNOSIS — R399 Unspecified symptoms and signs involving the genitourinary system: Secondary | ICD-10-CM

## 2024-07-22 DIAGNOSIS — L299 Pruritus, unspecified: Secondary | ICD-10-CM

## 2024-07-22 DIAGNOSIS — E038 Other specified hypothyroidism: Secondary | ICD-10-CM

## 2024-07-22 DIAGNOSIS — R351 Nocturia: Secondary | ICD-10-CM

## 2024-07-22 DIAGNOSIS — R300 Dysuria: Secondary | ICD-10-CM

## 2024-07-22 DIAGNOSIS — N184 Chronic kidney disease, stage 4 (severe): Secondary | ICD-10-CM

## 2024-07-22 LAB — COMPREHENSIVE METABOLIC PANEL WITH GFR
ALT: 15 U/L (ref 3–53)
AST: 17 U/L (ref 5–37)
Albumin: 2.7 g/dL — ABNORMAL LOW (ref 3.5–5.2)
Alkaline Phosphatase: 83 U/L (ref 39–117)
BUN: 53 mg/dL — ABNORMAL HIGH (ref 6–23)
CO2: 23 meq/L (ref 19–32)
Calcium: 8.2 mg/dL — ABNORMAL LOW (ref 8.4–10.5)
Chloride: 112 meq/L (ref 96–112)
Creatinine, Ser: 3.22 mg/dL — ABNORMAL HIGH (ref 0.40–1.50)
GFR: 16.37 mL/min — ABNORMAL LOW
Glucose, Bld: 86 mg/dL (ref 70–99)
Potassium: 5.3 meq/L — ABNORMAL HIGH (ref 3.5–5.1)
Sodium: 139 meq/L (ref 135–145)
Total Bilirubin: 0.2 mg/dL (ref 0.2–1.2)
Total Protein: 5.5 g/dL — ABNORMAL LOW (ref 6.0–8.3)

## 2024-07-22 LAB — URINALYSIS, ROUTINE W REFLEX MICROSCOPIC
Bilirubin Urine: NEGATIVE
Ketones, ur: NEGATIVE
Leukocytes,Ua: NEGATIVE
Nitrite: NEGATIVE
Specific Gravity, Urine: 1.02 (ref 1.000–1.030)
Total Protein, Urine: >=300 — AB
Urine Glucose: 100 — AB
Urobilinogen, UA: 0.2 (ref 0.0–1.0)
pH: 6 (ref 5.0–8.0)

## 2024-07-22 LAB — TSH: TSH: 5.26 u[IU]/mL (ref 0.35–5.50)

## 2024-07-22 LAB — PSA: PSA: 4.96 ng/mL — ABNORMAL HIGH (ref 0.10–4.00)

## 2024-07-22 LAB — PHOSPHORUS: Phosphorus: 4.5 mg/dL (ref 2.3–4.6)

## 2024-07-22 MED ORDER — FEXOFENADINE HCL 180 MG PO TABS
180.0000 mg | ORAL_TABLET | Freq: Every day | ORAL | 3 refills | Status: AC
  Filled 2024-07-22: qty 90, 90d supply, fill #0

## 2024-07-22 NOTE — Assessment & Plan Note (Signed)
 On Synthroid  25 mcg daily.  May be contributing to above pruritus.  Will check TSH.

## 2024-07-22 NOTE — Assessment & Plan Note (Addendum)
 Patient was referred to nephrology several months ago but never followed up with them.  We are rechecking c-Met today as well as phosphorus as this may be contributing to his pruritus.  They are hesitant to see a nephrologist at this point as they do not see that this is a significant issue however we did discuss importance of preventative care especially given his recent GFR.  Will place another referral today.

## 2024-07-22 NOTE — Assessment & Plan Note (Signed)
 On Flomax  0.4 mg daily.  Check PSA per patient request.

## 2024-07-22 NOTE — Addendum Note (Signed)
 Addended by: IDA ELORA HERO on: 07/22/2024 01:04 PM   Modules accepted: Orders

## 2024-07-22 NOTE — Progress Notes (Signed)
 "  Randall Williams is a 89 y.o. male who presents today for an office visit.  Assessment/Plan:  New/Acute Problems: Pruritus Patient did not have much of a response to the triamcinolone  or desonide  over the last couple of months.  No obvious rashes on exam of torso or upper extremities though does have some venous stasis changes noted on lower extremity.  He may have xerosis cutis contributing to this however we will check labs today to look for other possible causes.  Recheck TSH and also check phosphorus and c-Met.  Also recommended over-the-counter Allegra  as well to see if this will help with his itching.  Patient would like to be referred to see dermatologist.  Will place referral today.  Chronic Problems Addressed Today: Subclinical hypothyroidism On Synthroid  25 mcg daily.  May be contributing to above pruritus.  Will check TSH.  Chronic kidney disease (CKD), stage IV (severe) (HCC) Patient was referred to nephrology several months ago but never followed up with them.  We are rechecking c-Met today as well as phosphorus as this may be contributing to his pruritus.  They hesitant to see a nephrologist at this point as they do not see that this is a significant issue however we did discuss importance of preventative care especially given his recent GFR.  Will place another referral today.   Lower urinary tract symptoms (LUTS) On Flomax  0.4 mg daily.  Check PSA per patient request.     Subjective:  HPI:  See assessment / plan for status of chronic conditions.  Patient is here today for follow-up.  I last saw him about a month ago for leg edema and dermatitis.  We started topical triamcinolone  to help with the dermatitis.  Did not have previous success with desonide .  We also checked labs at his most recent visit here.  Labs were notable for creatinine of 3.0   Discussed the use of AI scribe software for clinical note transcription with the patient, who gave verbal consent to  proceed.  History of Present Illness Randall Williams is an 89 year old male with stage four kidney disease who presents for follow-up of leg edema and dermatitis.  He was last seen a month ago and started on topical triamcinolone  for dermatitis, which provided some relief for a couple of hours but did not fully resolve the itching. He had previously tried desonide  without success. The itching occurs on his arm, back, and legs, and is exacerbated when his body gets warm. No rash is noted, only itching.  He has a history of stage four kidney disease. Previous lab work showed a creatinine level of 3.0. No pain associated with his kidney condition.  He is currently taking thyroid  medication at a dose of 25 mcg, having previously taken 50 mcg. He experiences frequent urination at night, getting up eight to ten times.  He has not tried Allegra  for his itching, which is an allergy medication that could potentially help with his symptoms.         Objective:  Physical Exam: BP 130/60   Pulse 76   Temp (!) 97.2 F (36.2 C) (Temporal)   Ht 5' 2 (1.575 m)   Wt 137 lb 3.2 oz (62.2 kg)   SpO2 96%   BMI 25.09 kg/m   Wt Readings from Last 3 Encounters:  07/22/24 137 lb 3.2 oz (62.2 kg)  06/21/24 135 lb 3.2 oz (61.3 kg)  02/27/24 132 lb 3.2 oz (60 kg)    Gen: No acute distress,  resting comfortably CV: Regular rate and rhythm with no murmurs appreciated Pulm: Normal work of breathing, clear to auscultation bilaterally with no crackles, wheezes, or rhonchi Skin: Xerosis cutis noted throughout.  Venous stasis changes noted in the lower extremities. Neuro: Grossly normal, moves all extremities Psych: Normal affect and thought content      Randall Williams M. Kennyth, MD 07/22/2024 11:54 AM  "

## 2024-07-22 NOTE — Patient Instructions (Signed)
 It was very nice to see you today!  VISIT SUMMARY: During your visit, we addressed your ongoing itching, stage 4 kidney disease, frequent nighttime urination, and thyroid  medication. We have made some adjustments to your treatment plan and ordered additional tests to better manage your conditions.  YOUR PLAN: PRURITUS: You have chronic itching on your arms, back, and legs that has not been fully relieved by the current treatment. -Start taking Allegra  (fexofenadine ) as it may help with your symptoms. -You have been referred to a dermatologist for further evaluation.  CHRONIC KIDNEY DISEASE, STAGE 4: Your kidney function is significantly reduced, with a creatinine level of 3.0. -It is important to manage your electrolytes to prevent complications. -We have ordered blood work to check your phosphate levels and kidney function. -Continue trying to get an appointment with a nephrologist.  LOWER URINARY TRACT SYMPTOMS WITH NOCTURIA: You are experiencing frequent urination at night, which may be related to your prostate. -We have ordered blood work to check your PSA levels.  SUBCLINICAL HYPOTHYROIDISM: You are currently taking 25 mcg of thyroid  medication without significant side effects. -We have ordered blood work to recheck your thyroid  function.  Return if symptoms worsen or fail to improve.   Take care, Dr Kennyth  PLEASE NOTE:  If you had any lab tests, please let us  know if you have not heard back within a few days. You may see your results on mychart before we have a chance to review them but we will give you a call once they are reviewed by us .   If we ordered any referrals today, please let us  know if you have not heard from their office within the next week.   If you had any urgent prescriptions sent in today, please check with the pharmacy within an hour of our visit to make sure the prescription was transmitted appropriately.   Please try these tips to maintain a healthy  lifestyle:  Eat at least 3 REAL meals and 1-2 snacks per day.  Aim for no more than 5 hours between eating.  If you eat breakfast, please do so within one hour of getting up.   Each meal should contain half fruits/vegetables, one quarter protein, and one quarter carbs (no bigger than a computer mouse)  Cut down on sweet beverages. This includes juice, soda, and sweet tea.   Drink at least 1 glass of water with each meal and aim for at least 8 glasses per day  Exercise at least 150 minutes every week.

## 2024-07-23 ENCOUNTER — Other Ambulatory Visit (HOSPITAL_BASED_OUTPATIENT_CLINIC_OR_DEPARTMENT_OTHER): Payer: Self-pay

## 2024-07-23 ENCOUNTER — Encounter: Payer: Self-pay | Admitting: Hematology and Oncology

## 2024-07-23 LAB — URINE CULTURE
MICRO NUMBER:: 17553693
Result:: NO GROWTH
SPECIMEN QUALITY:: ADEQUATE
